# Patient Record
Sex: Female | Born: 1985 | Race: Black or African American | Hispanic: No | Marital: Single | Smoking: Current every day smoker
Health system: Southern US, Community
[De-identification: ages and names within clinical notes are randomized; demographics above are authoritative.]

## PROBLEM LIST (undated history)

## (undated) DIAGNOSIS — F329 Major depressive disorder, single episode, unspecified: Secondary | ICD-10-CM

## (undated) DIAGNOSIS — F419 Anxiety disorder, unspecified: Secondary | ICD-10-CM

## (undated) DIAGNOSIS — F32A Depression, unspecified: Secondary | ICD-10-CM

## (undated) HISTORY — PX: MOUTH SURGERY: SHX715

---

## 2015-01-23 ENCOUNTER — Telehealth: Payer: Self-pay

## 2015-01-23 NOTE — Telephone Encounter (Signed)
[facility] - Ambulatory Telephone Intake Screen     Patient Information:    Patient's Name: Patty Flores  Patient's Date of Birth: 06/12/1986  Patient's Medical Record Number: [id number]  Patient's Home Phone: [phone] (home)  Patient's Work Phone: [phone] (work)  [employer] AutoPatient's Mobile Phone:   No relevant phone numbers on file.     Patient's Marital Status (if applicable): Single  Can a message be left? yes      Insurance Information:    Medicaid   Policy number:     Referral Source:  Referral Source: self      Presenting Concern:     What is the reason you are seeking care?     (Please provide a brief description of the reasons the patient or referring provider is seeking mental health treatment at this time.  Please use patients own words where possible).    Depression, bipolar , bad reaction to meds                                    Mental Health History:    Are you currently involved in mental health treatment?  no     Are you currently taking a long acting injectable medication?  no    Will you need an injection at the time of your first intake assessment appointment?   no      Customer Service:    Do you need arrangements for?    Wheelchair: no    Interpreter Services:no

## 2015-03-01 NOTE — Progress Notes (Signed)
[facility] MISSED/CANCELLED APPOINTMENT     Name: Patty Flores [employer]  MRN: [id number]   DOB: 03/25/1986    Date of Scheduled Service: 03/01/15    Ms. Cadman was a no show for today's appointment.  I have sent the patient a letter regarding today's missed appointment.    Additional Information:    Not applicable

## 2015-11-07 ENCOUNTER — Telehealth: Payer: Self-pay | Admitting: Primary Care

## 2015-11-07 NOTE — Telephone Encounter (Signed)
This individual called, states she does not have a [place] doctor [place] in [place], she is working in a [employer] and needs a form completed and PPD done asap. She would like a new patient acute appointment. NPV scheduled for 11/28/15 with Dr. Ezzie DuralLis. Please advise.

## 2015-11-07 NOTE — Telephone Encounter (Signed)
Provided patient with 2 options today, unable to come in today at all. I explained to pt to call tomorrow to see if there are any open slots. Pt. verbalized understanding.

## 2015-11-08 ENCOUNTER — Encounter: Payer: Self-pay | Admitting: Primary Care

## 2015-11-08 ENCOUNTER — Ambulatory Visit: Payer: Self-pay | Admitting: Primary Care

## 2015-11-08 VITALS — BP 100/64 | HR 72 | Ht 61.5 in | Wt 207.0 lb

## 2015-11-08 DIAGNOSIS — F17200 Nicotine dependence, unspecified, uncomplicated: Secondary | ICD-10-CM | POA: Insufficient documentation

## 2015-11-08 DIAGNOSIS — Z0000 Encounter for general adult medical examination without abnormal findings: Secondary | ICD-10-CM

## 2015-11-08 NOTE — Telephone Encounter (Signed)
Spoke with pt, scheduled acute visit for today

## 2015-11-08 NOTE — Progress Notes (Signed)
[facility] - NEW PATIENT ACUTE OFFICE VISIT NOTE    SUBJECTIVE    1. Form completion  - Patty Flores presents today for PPD placement and completion of health clearance form to begin employment at a local daycare.  [place] within the past year, doing similar work previously.    ROS:  Gen: Generally feeling well  Eyes: No recent visual changes  ENT:  No nasal discharge, throat pain, hearing unchanged; several sensitive teeth, needs to establish care with dentist locally  CV:  No palpitations or chest pain, peripheral edema or claudication  Resp: No wheezing or shortness of breath  GI:  No nausea, vomiting or constipation; eating and drinking normally  GU:  Normal urination, no abnormal vaginal discharge or bleeding  MSK: No joint or muscle aches; no difficulty walking   Skin:  No noted rashes or other skin lesions  Neuro: No headaches, dizziness, numbness or tingling  Psych:  Mood stable  Endo: No extraordinary fatigue, skin/hair changes  Heme: No easy bruising    SOC: Lives with her fiancee of 6 months, Patty Flores, and her own 3 children ages 555,4 and 2. She does not drink EtOH or use illicit substance.  She smokes 3-4 cigarettes daily.    I personally reviewed the patients medications, problem list and PMSH in Epic today immediatly prior and/or during our visit.    OBJECTIVE    Vitals:    11/08/15 1029   BP: 100/64   Pulse: 72   Weight: 93.9 kg (207 lb)   Height: 1.562 m (5' 1.5")       Physical Exam:  GEN: Obese, otherwise well appearing, pleasant  EYES: Conjunctiva pink, pupils equal and reactive.    ENMT: Oropharynx pink, mucous membranes moist.  Adequate dentition.  NECK: Neck with normal appearance.  No thyromegaly, thyroid non-tender.  CV: Regular rate and rhythm, nl S1 and S2  RESP: Breathing unlabored, lungs clear to auscultation bilaterally  ABD: Normoactive bowel sounds, non-tender to palpation throughout.  No noted hepatomegaly.  MSK: Well developed, normal  gait.  PSYCH: Alert and oriented x3, affect appropriate    ASSESSMENT & PLAN    1. Nicotine dependence  - Counseled regarding importance of smoking cessation.  Pre-contemplative.  Reviewed importance of developing a quit plan, available pharmacologic treatments, and soliciting support from friends/family/co-workers.  Will continue to assess at future visits.     2. Healthcare maintenance  - Tdap >/= 1370yr(Boostrix)  - TB Skin Test  (AMBULATORY USE ONLY)  - Influenza vaccine and Pneumovax declined today  - Due for routine cervical cancer screening.  Asked to schedule appointment during the next 2-4 months      Patty Courtright, MD 10:32 AM  11/08/2015  Family Medicine

## 2015-11-08 NOTE — Telephone Encounter (Signed)
Pt calling to see if she can get a same day appt today,thanks

## 2015-11-10 ENCOUNTER — Ambulatory Visit: Payer: Self-pay

## 2015-11-10 LAB — TB SKIN TEST: Induration:TB skin test: NEGATIVE mm

## 2015-11-10 NOTE — Progress Notes (Signed)
PPD Reading Note  PPD read and results entered in EpicCare.  Result: 0 mm induration.  Interpretation: negtaive  Allergic reaction: no

## 2015-11-28 ENCOUNTER — Ambulatory Visit: Payer: Self-pay | Admitting: Primary Care

## 2016-04-17 ENCOUNTER — Ambulatory Visit: Payer: Self-pay | Admitting: Oral and Maxillofacial Surgery

## 2016-07-21 ENCOUNTER — Encounter (HOSPITAL_COMMUNITY): Payer: Self-pay

## 2016-07-21 ENCOUNTER — Emergency Department (HOSPITAL_COMMUNITY)
Admission: EM | Admit: 2016-07-21 | Discharge: 2016-07-21 | Disposition: A | Discharge status: Home / Self Care | Payer: Medicaid Other | Attending: Emergency Medicine | Admitting: Emergency Medicine

## 2016-07-21 DIAGNOSIS — F17210 Nicotine dependence, cigarettes, uncomplicated: Secondary | ICD-10-CM | POA: Insufficient documentation

## 2016-07-21 DIAGNOSIS — K0889 Other specified disorders of teeth and supporting structures: Secondary | ICD-10-CM | POA: Insufficient documentation

## 2016-07-21 DIAGNOSIS — F1290 Cannabis use, unspecified, uncomplicated: Secondary | ICD-10-CM | POA: Insufficient documentation

## 2016-07-21 HISTORY — DX: Anxiety disorder, unspecified: F41.9

## 2016-07-21 HISTORY — DX: Depression, unspecified: F32.A

## 2016-07-21 HISTORY — DX: Major depressive disorder, single episode, unspecified: F32.9

## 2016-07-21 MED ORDER — NAPROXEN 500 MG PO TABS: 500.0000 mg | ORAL_TABLET | Freq: Two times a day (BID) | ORAL | 0 refills | Status: DC

## 2016-07-21 MED ORDER — PENICILLIN V POTASSIUM 500 MG PO TABS: 500.0000 mg | ORAL_TABLET | Freq: Four times a day (QID) | ORAL | 0 refills | Status: DC

## 2016-07-21 NOTE — ED Notes (Signed)
Pt provided a warm pack by Raynelle Fanning NT, per request.

## 2016-07-21 NOTE — ED Provider Notes (Signed)
[facility]-EMERGENCY DEPT Provider Note   CSN: [id number] Arrival date & time: 07/21/16  1914  First Provider Contact:  First MD Initiated Contact with Patient 07/21/16 [phone]        History   Chief Complaint Chief Complaint  Patient presents with  . Dental Pain    HPI Betty Thompson is a 30 y.o. female.  Patient presents to the emergency department complaining of left upper dental pain ongoing for the past 2 months that has worsened recently. She went to 10 out of 10 pain currently. She reports she's been using Orajel without relief. She reports she feels her dental filling has come loose several months ago. She denies any discharge from her mouth. She denies any sore throat or trouble swallowing. She recently moved to the area and does not have a dental provider. She denies any allergies to any medications. She denies fevers, sore throat, trouble swallowing, neck pain, changes to her vision, or rashes.    Dental Pain   Pertinent negatives include no chills, no fever and no headaches.    Past Medical History:  Diagnosis Date  . Anxiety   . Depression     There are no active problems to display for this patient.   Past Surgical History:  Procedure Laterality Date  . CESAREAN SECTION    . MOUTH SURGERY      OB History    No data available       Home Medications    Prior to Admission medications   Medication Sig Start Date End Date Taking? Authorizing Provider  naproxen (NAPROSYN) 500 MG tablet Take 1 tablet (500 mg total) by mouth 2 (two) times daily with a meal. 07/21/16   Everlene Farrier, PA-C  penicillin v potassium (VEETID) 500 MG tablet Take 1 tablet (500 mg total) by mouth 4 (four) times daily. 07/21/16   Everlene Farrier, PA-C    Family History No family history on file.  Social History Social History  Substance Use Topics  . Smoking status: Current Every Day Smoker    Types: Cigarettes  . Smokeless tobacco: Never Used  . Alcohol use No     Allergies     Review of patient's allergies indicates not on file.   Review of Systems Review of Systems  Constitutional: Negative for chills and fever.  HENT: Positive for dental problem. Negative for drooling, ear pain, facial swelling, sore throat and trouble swallowing.   Eyes: Negative for pain and visual disturbance.  Musculoskeletal: Negative for neck pain and neck stiffness.  Skin: Negative for rash.  Neurological: Negative for headaches.     Physical Exam Updated Vital Signs BP 120/84 (BP Location: Left Arm)   Pulse 75   Temp 98.4 F (36.9 C) (Oral)   Resp 20   LMP 07/02/2016   SpO2 100%   Physical Exam  Constitutional: She is oriented to person, place, and time. She appears well-developed and well-nourished. No distress.  Nontoxic appearing.  HENT:  Head: Normocephalic and atraumatic.  Right Ear: External ear normal.  Left Ear: External ear normal.  Mouth/Throat: Oropharynx is clear and moist. No oropharyngeal exudate.  Tenderness to her left upper molars. No discharge or fluctuance. No gross abscess. Tongue protrusion is normal. Throat is clear. No tonsillar hypertrophy or exudates. Uvula is midline without edema. No trismus. Bilateral tympanic membranes are pearly-gray without erythema or loss of landmarks.   Eyes: Conjunctivae are normal. Pupils are equal, round, and reactive to light. Right eye exhibits no discharge. Left eye  exhibits no discharge.  Neck: Normal range of motion. Neck supple. No JVD present.  Cardiovascular: Normal rate, regular rhythm, normal heart sounds and intact distal pulses.   Pulmonary/Chest: Effort normal and breath sounds normal. No stridor. No respiratory distress.  Abdominal: Soft. There is no tenderness.  Lymphadenopathy:    She has no cervical adenopathy.  Neurological: She is alert and oriented to person, place, and time. No cranial nerve deficit.  Skin: Skin is warm and dry. Capillary refill takes less than 2 seconds. No rash noted. She is  not diaphoretic. No erythema. No pallor.  Psychiatric: She has a normal mood and affect. Her behavior is normal.  Nursing note and vitals reviewed.    ED Treatments / Results  Labs (all labs ordered are listed, but only abnormal results are displayed) Labs Reviewed - No data to display  EKG  EKG Interpretation None       Radiology No results found.  Procedures Procedures (including critical care time)  Medications Ordered in ED Medications - No data to display   Initial Impression / Assessment and Plan / ED Course  I have reviewed the triage vital signs and the nursing notes.    Clinical Course    Patient with left upper molar toothache.  No gross abscess.  Exam unconcerning for Ludwig's angina or spread of infection.  Will treat with penicillin and pain medicine.  Urged patient to follow-up with dentist.  Dental resource guide provided. I advised the patient to follow-up with their primary care provider this week. I advised the patient to return to the emergency department with new or worsening symptoms or new concerns. The patient verbalized understanding and agreement with plan.     Final Clinical Impressions(s) / ED Diagnoses   Final diagnoses:  Pain, dental    New Prescriptions New Prescriptions   NAPROXEN (NAPROSYN) 500 MG TABLET    Take 1 tablet (500 mg total) by mouth 2 (two) times daily with a meal.   PENICILLIN V POTASSIUM (VEETID) 500 MG TABLET    Take 1 tablet (500 mg total) by mouth 4 (four) times daily.     Everlene Farrier, PA-C 07/21/16 5974    Nelva Nay, MD 07/21/16 [phone]

## 2016-07-21 NOTE — ED Triage Notes (Signed)
Per EMS, Pt, from home, c/o L side dental pain x 2 months.  Pain score 10/10.  Pt reports using Orajel w/o relief.  Pt reports pain r/t cavity.

## 2017-08-15 ENCOUNTER — Emergency Department (HOSPITAL_COMMUNITY)
Admission: EM | Admit: 2017-08-15 | Discharge: 2017-08-15 | Disposition: A | Discharge status: Home / Self Care | Payer: Medicaid Other | Attending: Emergency Medicine | Admitting: Emergency Medicine

## 2017-08-15 ENCOUNTER — Encounter (HOSPITAL_COMMUNITY): Payer: Self-pay | Admitting: *Deleted

## 2017-08-15 DIAGNOSIS — M25571 Pain in right ankle and joints of right foot: Secondary | ICD-10-CM | POA: Insufficient documentation

## 2017-08-15 DIAGNOSIS — M25562 Pain in left knee: Secondary | ICD-10-CM | POA: Insufficient documentation

## 2017-08-15 DIAGNOSIS — G8929 Other chronic pain: Secondary | ICD-10-CM

## 2017-08-15 DIAGNOSIS — M25561 Pain in right knee: Secondary | ICD-10-CM | POA: Insufficient documentation

## 2017-08-15 DIAGNOSIS — F17210 Nicotine dependence, cigarettes, uncomplicated: Secondary | ICD-10-CM | POA: Insufficient documentation

## 2017-08-15 DIAGNOSIS — M25572 Pain in left ankle and joints of left foot: Secondary | ICD-10-CM | POA: Insufficient documentation

## 2017-08-15 MED ORDER — MELOXICAM 7.5 MG PO TABS: 15.0000 mg | ORAL_TABLET | Freq: Every day | ORAL | 0 refills | Status: DC

## 2017-08-15 NOTE — Discharge Instructions (Signed)
Take the prescribed medication as directed. Follow-up with the clinic as we have scheduled for you. Return to the ED for new or worsening symptoms.

## 2017-08-15 NOTE — ED Provider Notes (Signed)
[facility] DEPT Provider Note   CSN: [id number] Arrival date & time: 08/15/17  4628     History   Chief Complaint Chief Complaint  Patient presents with  . Knee Pain  . Ankle Pain    HPI Betty Thompson is a 31 y.o. female.  The history is provided by the patient and medical records.  Knee Pain    Ankle Pain       31 year old female with history of anxiety and depression, presenting to the ED for bilateral knee and ankle pain.  States she moved here from [place] about one year ago, has yet to find a PCP. States she has had pain in both of her knees for over 2 years now. Has been having ankle pain for the past 3 months. Denies any injury, trauma, or falls. States her knees and ankles feel very "stiff" with a lot of popping and clicking. She does stand on her feet 8 hours a day while working. She is not noticing any change with elevating her feet in the evening. She denies any numbness or weakness of her legs. She's not having any difficult walking.  Past Medical History:  Diagnosis Date  . Anxiety   . Depression     There are no active problems to display for this patient.   Past Surgical History:  Procedure Laterality Date  . CESAREAN SECTION    . MOUTH SURGERY      OB History    No data available       Home Medications    Prior to Admission medications   Medication Sig Start Date End Date Taking? Authorizing Provider  naproxen (NAPROSYN) 500 MG tablet Take 1 tablet (500 mg total) by mouth 2 (two) times daily with a meal. 07/21/16   Waynetta Pean, PA-C  penicillin v potassium (VEETID) 500 MG tablet Take 1 tablet (500 mg total) by mouth 4 (four) times daily. 07/21/16   Waynetta Pean, PA-C    Family History No family history on file.  Social History Social History  Substance Use Topics  . Smoking status: Current Every Day Smoker    Types: Cigarettes  . Smokeless tobacco: Never Used  . Alcohol use No     Allergies   Patient has no known  allergies.   Review of Systems Review of Systems  Musculoskeletal: Positive for arthralgias.  All other systems reviewed and are negative.    Physical Exam Updated Vital Signs BP 122/62 (BP Location: Left Arm)   Pulse 74   Temp 97.9 F (36.6 C) (Oral)   Resp 18   LMP 08/11/2017 (Exact Date)   SpO2 100%   Physical Exam  Constitutional: She is oriented to person, place, and time. She appears well-developed and well-nourished.  HENT:  Head: Normocephalic and atraumatic.  Mouth/Throat: Oropharynx is clear and moist.  Eyes: Pupils are equal, round, and reactive to light. Conjunctivae and EOM are normal.  Neck: Normal range of motion.  Cardiovascular: Normal rate, regular rhythm and normal heart sounds.   Pulmonary/Chest: Effort normal and breath sounds normal.  Abdominal: Soft. Bowel sounds are normal.  Musculoskeletal: Normal range of motion.  Both knees with crepitus with range of motion testing, right slightly worse than left, no acute bony deformities Bilateral ankles with trace edema, no apparent pain with range of motion, no deformities, DP pulses intact  Neurological: She is alert and oriented to person, place, and time.  Skin: Skin is warm and dry.  Psychiatric: She has a normal mood  and affect.  Nursing note and vitals reviewed.    ED Treatments / Results  Labs (all labs ordered are listed, but only abnormal results are displayed) Labs Reviewed - No data to display  EKG  EKG Interpretation None       Radiology No results found.  Procedures Procedures (including critical care time)  Medications Ordered in ED Medications - No data to display   Initial Impression / Assessment and Plan / ED Course  I have reviewed the triage vital signs and the nursing notes.  Pertinent labs & imaging results that were available during my care of the patient were reviewed by me and considered in my medical decision making (see chart for details).  31 year old female  here with bilateral knee and ankle pain. This is been an ongoing issue for her. No bony deformities on exam to suggest acute fracture. No reported trauma. Based on the nature of her symptoms and crepitus on exam, suspect there is component of arthritis. Doubt acute fracture or dislocation, imaging deferred.  Patient does not have any follow-up. We'll start her on meloxicam.  Case management has met with her to help assist with follow-up-- appt made for 9/13.  Discussed plan with patient, she acknowledged understanding and agreed with plan of care.  Return precautions given for new or worsening symptoms.  Final Clinical Impressions(s) / ED Diagnoses   Final diagnoses:  Chronic pain of both knees  Chronic pain of both ankles    New Prescriptions New Prescriptions   MELOXICAM (MOBIC) 7.5 MG TABLET    Take 2 tablets (15 mg total) by mouth daily.     Larene Pickett, PA-C 08/15/17 1213    Little, Wenda Overland, MD 08/16/17 2109

## 2017-08-15 NOTE — ED Triage Notes (Signed)
Pt is here bilateral knee pain for 2 years and now ankles hurt too.

## 2017-09-11 ENCOUNTER — Inpatient Hospital Stay (INDEPENDENT_AMBULATORY_CARE_PROVIDER_SITE_OTHER): Payer: Medicaid Other | Admitting: Physician Assistant

## 2017-11-13 ENCOUNTER — Emergency Department (HOSPITAL_COMMUNITY)
Admission: EM | Admit: 2017-11-13 | Discharge: 2017-11-13 | Disposition: A | Discharge status: Home / Self Care | Payer: Self-pay | Attending: Physician Assistant | Admitting: Physician Assistant

## 2017-11-13 ENCOUNTER — Encounter (HOSPITAL_COMMUNITY): Payer: Self-pay | Admitting: Emergency Medicine

## 2017-11-13 DIAGNOSIS — Z23 Encounter for immunization: Secondary | ICD-10-CM | POA: Insufficient documentation

## 2017-11-13 DIAGNOSIS — L0291 Cutaneous abscess, unspecified: Secondary | ICD-10-CM

## 2017-11-13 DIAGNOSIS — F17210 Nicotine dependence, cigarettes, uncomplicated: Secondary | ICD-10-CM | POA: Insufficient documentation

## 2017-11-13 DIAGNOSIS — L02416 Cutaneous abscess of left lower limb: Secondary | ICD-10-CM | POA: Insufficient documentation

## 2017-11-13 DIAGNOSIS — Z79899 Other long term (current) drug therapy: Secondary | ICD-10-CM | POA: Insufficient documentation

## 2017-11-13 MED ORDER — LIDOCAINE-EPINEPHRINE (PF) 2 %-1:200000 IJ SOLN
10.0000 mL | Freq: Once | INTRAMUSCULAR | Status: AC
  Administered 2017-11-13: 10 mL
  Filled 2017-11-13: qty 20

## 2017-11-13 MED ORDER — TETANUS-DIPHTH-ACELL PERTUSSIS 5-2.5-18.5 LF-MCG/0.5 IM SUSP
0.5000 mL | Freq: Once | INTRAMUSCULAR | Status: AC
  Administered 2017-11-13: 0.5 mL via INTRAMUSCULAR
  Filled 2017-11-13: qty 0.5

## 2017-11-13 MED ORDER — DOXYCYCLINE HYCLATE 100 MG PO CAPS: 100.0000 mg | ORAL_CAPSULE | Freq: Two times a day (BID) | ORAL | 0 refills | Status: DC

## 2017-11-13 NOTE — ED Provider Notes (Signed)
[facility] EMERGENCY [facility]PARTMENT Provider Note   CSN: [id number] Arrival date & time: 11/13/17  [id number]     History   Chief Complaint Chief Complaint  Patient presents with  . Abscess    HPI Betty Thompson is a 31 y.o. female presenting with abscess of left thigh.  Patient states that for the past 6 days, she has had a painful lesion of her medial left thigh.  There is been no drainage from this.  It has been increasingly painful.  She has not taken anything for pain.  Nothing makes it better, palpation makes it worse.  She denies history of abscesses before.  She denies fevers, chills, nausea, vomiting.  She is not immunocompromised.  She is not on blood thinners.  She does not know when her last tetanus shot was, thinks it was when she was 18 (~13 yrs ago).  She has no other medical problems, does not take medications daily.  HPI  Past Medical History:  Diagnosis Date  . Anxiety   . Depression     There are no active problems to display for this patient.   Past Surgical History:  Procedure Laterality Date  . CESAREAN SECTION    . MOUTH SURGERY      OB History    No data available       Home Medications    Prior to Admission medications   Medication Sig Start Date End Date Taking? Authorizing Provider  doxycycline (VIBRAMYCIN) 100 MG capsule Take 1 capsule (100 mg total) 2 (two) times daily by mouth. 11/13/17   Johnda Billiot, PA-C  meloxicam (MOBIC) 7.5 MG tablet Take 2 tablets (15 mg total) by mouth daily. 08/15/17   Garlon HatchetSanders, Lisa M, PA-C  naproxen (NAPROSYN) 500 MG tablet Take 1 tablet (500 mg total) by mouth 2 (two) times daily with a meal. 07/21/16   Everlene Farrieransie, William, PA-C  penicillin v potassium (VEETID) 500 MG tablet Take 1 tablet (500 mg total) by mouth 4 (four) times daily. 07/21/16   Everlene Farrieransie, William, PA-C    Family History No family history on file.  Social History Social History   Tobacco Use  . Smoking status: Current Every Day  Smoker    Types: Cigarettes  . Smokeless tobacco: Never Used  Substance Use Topics  . Alcohol use: No  . Drug use: Yes    Frequency: 7.0 times per week    Types: Marijuana     Allergies   Patient has no known allergies.   Review of Systems Review of Systems  Constitutional: Negative for chills and fever.  Skin: Positive for color change.  Hematological: Does not bruise/bleed easily.     Physical Exam Updated Vital Signs BP 112/69 (BP Location: Right Arm)   Pulse 77   Temp 98.4 F (36.9 C) (Oral)   Resp 16   Ht 5\' 2"  (1.575 m)   Wt 101.2 kg (223 lb)   LMP 10/31/2017   SpO2 97%   BMI 40.79 kg/m   Physical Exam  Constitutional: She is oriented to person, place, and time. She appears well-developed and well-nourished. No distress.  HENT:  Head: Normocephalic and atraumatic.  Eyes: EOM are normal.  Neck: Normal range of motion.  Cardiovascular: Normal rate, regular rhythm and intact distal pulses.  Pulmonary/Chest: Effort normal and breath sounds normal. No respiratory distress. She has no wheezes. She has no rales.  Abdominal: She exhibits no distension.  Musculoskeletal: Normal range of motion.  Neurological: She is alert and  oriented to person, place, and time.  Skin: Skin is warm. No rash noted.  Fluctuant tender lesion of left medial upper thigh without drainage.  No surrounding skin erythema or signs of cellulitis.  Psychiatric: She has a normal mood and affect.  Nursing note and vitals reviewed.    ED Treatments / Results  Labs (all labs ordered are listed, but only abnormal results are displayed) Labs Reviewed - No data to display  EKG  EKG Interpretation None       Radiology No results found.  Procedures .Marland Kitchen.Incision and Drainage Date/Time: 11/13/2017 9:30 AM Performed by: Alveria Apleyaccavale, Bereket Gernert, PA-C Authorized by: Alveria Apleyaccavale, Lavanya Roa, PA-C   Consent:    Consent obtained:  Verbal   Consent given by:  Patient   Risks discussed:  Incomplete  drainage, bleeding, infection and pain Location:    Type:  Abscess   Location:  Lower extremity   Lower extremity location:  Leg   Leg location:  L upper leg Pre-procedure details:    Skin preparation:  Betadine Anesthesia (see MAR for exact dosages):    Anesthesia method:  Local infiltration   Local anesthetic:  Lidocaine 2% WITH epi Procedure type:    Complexity:  Simple Procedure details:    Incision types:  Single straight   Incision depth:  Dermal   Scalpel blade:  11   Wound management:  Probed and deloculated and irrigated with saline   Drainage:  Purulent and bloody   Drainage amount:  Moderate   Wound treatment:  Wound left open   Packing materials:  None Post-procedure details:    Patient tolerance of procedure:  Tolerated well, no immediate complications   (including critical care time)  Medications Ordered in ED Medications  lidocaine-EPINEPHrine (XYLOCAINE W/EPI) 2 %-1:200000 (PF) injection 10 mL (10 mLs Infiltration Given 11/13/17 0930)  Tdap (BOOSTRIX) injection 0.5 mL (0.5 mLs Intramuscular Given 11/13/17 0928)     Initial Impression / Assessment and Plan / ED Course  I have reviewed the triage vital signs and the nursing notes.  Pertinent labs & imaging results that were available during my care of the patient were reviewed by me and considered in my medical decision making (see chart for details).     Patient presenting with abscess of left upper thigh.  No signs of systemic infection including fever, tachycardia, or vomiting.  Tetanus updated.  Abscess drained and purulent material expressed.  Aftercare instructions given.  Patient placed on antibiotics.  Patient to return if symptoms worsen.  At this time, patient appears safe for discharge.  Return precautions given.  Patient states she understands and agrees to plan.   Final Clinical Impressions(s) / ED Diagnoses   Final diagnoses:  Abscess    ED Discharge Orders        Ordered    doxycycline  (VIBRAMYCIN) 100 MG capsule  2 times daily     11/13/17 1001       Wellmanaccavale, Emerlyn Mehlhoff, PA-C 11/13/17 1834    Abelino DerrickMackuen, Courteney Lyn, MD 11/15/17 1519

## 2017-11-13 NOTE — ED Notes (Signed)
Pt verbalized understanding of discharge instructions and denies any further questions at this time.   

## 2017-11-13 NOTE — ED Notes (Signed)
ED Provider at bedside. 

## 2017-11-13 NOTE — Discharge Instructions (Signed)
Take antibiotics as prescribed.  Take the entire course, even if your symptoms are improved. Use Tylenol or ibuprofen as needed for pain. Keep the dressing on for the next 24 hours.  After this, you may remove dressing, wash gently soap and water, and reapply a new dressing.  Do this until it is no longer draining. Try not to wear tight fitting clothes while the cut is healing, as this may make it take longer to heal. Return to the emergency room if you develop fevers, chills, worsening symptoms, or any new or concerning symptoms.

## 2017-11-13 NOTE — ED Triage Notes (Signed)
Pt. Stated, I have a boil on the crease of my left upper leg.

## 2018-07-04 ENCOUNTER — Emergency Department (HOSPITAL_COMMUNITY): Payer: Self-pay

## 2018-07-04 ENCOUNTER — Emergency Department (HOSPITAL_COMMUNITY)
Admission: EM | Admit: 2018-07-04 | Discharge: 2018-07-05 | Disposition: A | Discharge status: Home / Self Care | Payer: Self-pay | Attending: Emergency Medicine | Admitting: Emergency Medicine

## 2018-07-04 ENCOUNTER — Encounter (HOSPITAL_COMMUNITY): Payer: Self-pay | Admitting: Emergency Medicine

## 2018-07-04 ENCOUNTER — Other Ambulatory Visit: Payer: Self-pay

## 2018-07-04 DIAGNOSIS — F17210 Nicotine dependence, cigarettes, uncomplicated: Secondary | ICD-10-CM | POA: Insufficient documentation

## 2018-07-04 DIAGNOSIS — R0602 Shortness of breath: Secondary | ICD-10-CM | POA: Insufficient documentation

## 2018-07-04 DIAGNOSIS — F439 Reaction to severe stress, unspecified: Secondary | ICD-10-CM | POA: Insufficient documentation

## 2018-07-04 DIAGNOSIS — Z79899 Other long term (current) drug therapy: Secondary | ICD-10-CM | POA: Insufficient documentation

## 2018-07-04 DIAGNOSIS — R079 Chest pain, unspecified: Secondary | ICD-10-CM | POA: Insufficient documentation

## 2018-07-04 LAB — I-STAT BETA HCG BLOOD, ED (MC, WL, AP ONLY): I-stat hCG, quantitative: <5 m[IU]/mL (ref <5)

## 2018-07-04 LAB — CBC
HEMATOCRIT: 45.2 % (ref 36.0–46.0)
HEMOGLOBIN: 14.4 g/dL (ref 12.0–15.0)
MCH: 28.9 pg (ref 26.0–34.0)
MCHC: 31.9 g/dL (ref 30.0–36.0)
MCV: 90.8 fL (ref 78.0–100.0)
Platelets: 277 10*3/uL (ref 150–400)
RBC: 4.98 MIL/uL (ref 3.87–5.11)
RDW: 13.2 % (ref 11.5–15.5)
WBC: 8.3 10*3/uL (ref 4.0–10.5)

## 2018-07-04 LAB — I-STAT TROPONIN, ED: Troponin i, poc: 0 ng/mL (ref 0.00–0.08)

## 2018-07-04 LAB — BASIC METABOLIC PANEL
ANION GAP: 7 (ref 5–15)
BUN: 11 mg/dL (ref 6–20)
CHLORIDE: 113 mmol/L — AB (ref 98–111)
CO2: 20 mmol/L — AB (ref 22–32)
Calcium: 8.8 mg/dL — ABNORMAL LOW (ref 8.9–10.3)
Creatinine, Ser: 0.92 mg/dL (ref 0.44–1.00)
GFR calc non Af Amer: >60 mL/min (ref >60)
GLUCOSE: 94 mg/dL (ref 70–99)
POTASSIUM: 4.3 mmol/L (ref 3.5–5.1)
Sodium: 140 mmol/L (ref 135–145)

## 2018-07-04 MED ORDER — HYDROXYZINE HCL 25 MG PO TABS
25.0000 mg | ORAL_TABLET | Freq: Once | ORAL | Status: AC
  Administered 2018-07-04: 25 mg via ORAL
  Filled 2018-07-04: qty 1

## 2018-07-04 NOTE — ED Triage Notes (Addendum)
Pt presents with c/o chest pain traveling from Right to Left. Pain only on right x 2 days, today began traveling to L side. Denies nausea, +shob x 2-3 days with intermittent palpitations.   Pt sobbing in triage stating she is very stressed d/t recent breakup

## 2018-07-04 NOTE — ED Provider Notes (Signed)
[facility] Provider Note   CSN: [id number] Arrival date & time: 07/04/18  2131     History   Chief Complaint Chief Complaint  Patient presents with  . Chest Pain    HPI Betty Thompson is a 32 y.o. female.  The history is provided by the patient and medical records.  Chest Pain      32 year old female with history of anxiety, depression, presenting to the ED with chest pain.  Reports this is been intermittent over the past 2 to 3 days.  States she will wake up in the night with "twinges" in the left side of her chest which causes her to be short of breath.  States occassionally she will feel some palpitations "like her heart skipped a beat", none currently.  She has not had any associated diaphoresis, nausea, vomiting, dizziness, or weakness.  She denies any known cardiac history.  Father had CAD.  Patient does smoke cigarettes and marijuana.  Patient states she has been very worried about this has not been sleeping very much which is causing her to feel very tired and fatigued.  She also has been under a great deal of stress--recently separated from the father of her children, currently staying with her mother but found out she has to move out by the end of the month.  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  She also has been donating plasma twice weekly for the past 3 months for extra income.  Past Medical History:  Diagnosis Date  . Anxiety   . Depression     There are no active problems to display for this patient.   Past Surgical History:  Procedure Laterality Date  . CESAREAN SECTION    . MOUTH SURGERY       OB History   None      Home Medications    Prior to Admission medications   Medication Sig Start Date End Date Taking? Authorizing Provider  doxycycline (VIBRAMYCIN) 100 MG capsule Take 1 capsule (100 mg  total) 2 (two) times daily by mouth. 11/13/17   Caccavale, Sophia, PA-C  meloxicam (MOBIC) 7.5 MG tablet Take 2 tablets (15 mg total) by mouth daily. 08/15/17   Garlon HatchetSanders, Jobani Sabado M, PA-C  naproxen (NAPROSYN) 500 MG tablet Take 1 tablet (500 mg total) by mouth 2 (two) times daily with a meal. 07/21/16   Everlene Farrieransie, William, PA-C  penicillin v potassium (VEETID) 500 MG tablet Take 1 tablet (500 mg total) by mouth 4 (four) times daily. 07/21/16   Everlene Farrieransie, William, PA-C    Family History No family history on file.  Social History Social History   Tobacco Use  . Smoking status: Current Every Day Smoker    Types: Cigarettes  . Smokeless tobacco: Never Used  Substance Use Topics  . Alcohol use: No  . Drug use: Yes    Frequency: 7.0 times per week    Types: Marijuana     Allergies   Patient has no known allergies.   Review of Systems Review of Systems  Cardiovascular: Positive for chest pain.  All other systems reviewed and are negative.    Physical Exam Updated Vital Signs BP 120/81   Pulse 86   Temp 98.2 F (36.8 C) (Oral)   Resp 20   Ht 5\' 2"  (1.575 m)   Wt 90.7 kg (200 lb)  LMP 06/27/2018   SpO2 100%   BMI 36.58 kg/m   Physical Exam  Constitutional: She is oriented to person, place, and time. She appears well-developed and well-nourished.  HENT:  Head: Normocephalic and atraumatic.  Mouth/Throat: Oropharynx is clear and moist.  Eyes: Pupils are equal, round, and reactive to light. Conjunctivae and EOM are normal.  Neck: Normal range of motion.  Cardiovascular: Normal rate, regular rhythm and normal heart sounds.  Pulmonary/Chest: Effort normal and breath sounds normal. She has no decreased breath sounds. She has no wheezes.  Abdominal: Soft. Bowel sounds are normal.  Musculoskeletal: Normal range of motion.  Neurological: She is alert and oriented to person, place, and time.  Skin: Skin is warm and dry.  Psychiatric: Her mood appears anxious.  Intermittently sobbing  during exam Denies SI/HI/AVH  Nursing note and vitals reviewed.    ED Treatments / Results  Labs (all labs ordered are listed, but only abnormal results are displayed) Labs Reviewed  BASIC METABOLIC PANEL - Abnormal; Notable for the following components:      Result Value   Chloride 113 (*)    CO2 20 (*)    Calcium 8.8 (*)    All other components within normal limits  CBC  I-STAT TROPONIN, ED  I-STAT BETA HCG BLOOD, ED ([facility], [facility], [facility] ONLY)    EKG EKG Interpretation  Date/Time:  Saturday July 04 2018 21:39:56 EDT Ventricular Rate:  91 PR Interval:  140 QRS Duration: 76 QT Interval:  340 QTC Calculation: 418 R Axis:   33 Text Interpretation:  Normal sinus rhythm with sinus arrhythmia Cannot rule out Anterior infarct , age undetermined Abnormal ECG No previous ECGs available Confirmed by Glynn Octave [phone]) on 07/04/2018 11:40:20 PM   Radiology Dg Chest 2 View  Result Date: 07/04/2018 CLINICAL DATA:  Initial evaluation for acute chest pain. EXAM: CHEST - 2 VIEW COMPARISON:  None. FINDINGS: The cardiac and mediastinal silhouettes are within normal limits. The lungs are normally inflated. No airspace consolidation, pleural effusion, or pulmonary edema is identified. There is no pneumothorax. No acute osseous abnormality identified. IMPRESSION: No active cardiopulmonary disease. Electronically Signed   By: Rise Mu M.D.   On: 07/04/2018 22:19    Procedures Procedures (including critical care time)  Medications Ordered in ED Medications  hydrOXYzine (ATARAX/VISTARIL) tablet 25 mg (25 mg Oral Given 07/04/18 2351)  ondansetron (ZOFRAN-ODT) disintegrating tablet 4 mg (4 mg Oral Given 07/05/18 0029)     Initial Impression / Assessment and Plan / ED Course  I have reviewed the triage vital signs and the nursing notes.  Pertinent labs & imaging results that were available during my care of the patient were reviewed by me and considered in my medical decision making (see  chart for details).  32 year old female presenting to the ED with chest pain.  Describes it as "twinges" intermittently over the past 2 days with some associated shortness of breath and transient palpitations, will "like her heart skipping a beat".  EKG is nonischemic.  Screening labs overall reassuring.  Chest x-ray is clear.  She has no signs of fluid overload on exam.  She is not tachycardic or hypoxic, no pleuritc component to chest pain, and no significant risk factors for PE.  Does admit she has been under a great deal of stress, intermittently sobbing during exam.  I do feel that stress and anxiety is significantly contributing to her symptoms..  In regards to her fatigue, this is likely multifactorial.  She has been donating  plasma twice a week and is also not been sleeping as she is worried about her chest pains.  States she feels like she needs something to "calm her down".  Will give trial of Vistaril.  She will need close follow-up, given referral to wellness clinic.  Discussed plan with patient, she acknowledged understanding and agreed with plan of care.  Return precautions given for new or worsening symptoms.  Final Clinical Impressions(s) / ED Diagnoses   Final diagnoses:  Chest pain in adult  Stress    ED Discharge Orders        Ordered    hydrOXYzine (ATARAX/VISTARIL) 25 MG tablet  Every 6 hours PRN     07/05/18 0034       Garlon Hatchet, PA-C 07/05/18 0038    Donnetta Hutching, MD 07/05/18 [phone]

## 2018-07-05 MED ORDER — HYDROXYZINE HCL 25 MG PO TABS: 25.0000 mg | ORAL_TABLET | Freq: Four times a day (QID) | ORAL | 0 refills | Status: DC | PRN

## 2018-07-05 MED ORDER — ONDANSETRON 4 MG PO TBDP
4.0000 mg | ORAL_TABLET | Freq: Once | ORAL | Status: AC
  Administered 2018-07-05: 4 mg via ORAL
  Filled 2018-07-05: qty 1

## 2018-07-05 NOTE — Discharge Instructions (Signed)
Take the prescribed medication as directed.  This should help you relax and hopefully will help you rest. Follow-up with the patient care center-- call to make an appt on Monday morning. Return to the ED for new or worsening symptoms.

## 2018-12-14 ENCOUNTER — Encounter: Payer: Self-pay | Admitting: Primary Care

## 2019-02-02 ENCOUNTER — Telehealth: Payer: Self-pay | Admitting: Primary Care

## 2019-02-02 NOTE — Telephone Encounter (Signed)
[facility] Worker Note    Summary:    Clinical research associate reached the Patient on 02/02/19 via phone call  Guardian Name:    Target outcome is adult access to primary care    Patient or guardian identified reasons for not attending appointments: insurance      Plan: Patient states that she is currently not working and does not qualify for insurance through DSS. Patient states that she would like to come in to be seen and also would like a referral for Dental. Patient says she has not had medical/insurance for years and is currently not working.     Education Provided:    Resources Provided:    Referrals Provided:      Web designer Follow-up:                  Follow up with patient via phone call in  days.      Upcoming appointments:    No future appointments.

## 2019-07-27 ENCOUNTER — Emergency Department (HOSPITAL_COMMUNITY)
Admission: EM | Admit: 2019-07-27 | Discharge: 2019-07-27 | Disposition: A | Discharge status: Home / Self Care | Payer: Self-pay | Attending: Emergency Medicine | Admitting: Emergency Medicine

## 2019-07-27 ENCOUNTER — Encounter (HOSPITAL_COMMUNITY): Payer: Self-pay | Admitting: Emergency Medicine

## 2019-07-27 DIAGNOSIS — R059 Cough, unspecified: Secondary | ICD-10-CM

## 2019-07-27 DIAGNOSIS — R05 Cough: Secondary | ICD-10-CM | POA: Insufficient documentation

## 2019-07-27 DIAGNOSIS — F17210 Nicotine dependence, cigarettes, uncomplicated: Secondary | ICD-10-CM | POA: Insufficient documentation

## 2019-07-27 DIAGNOSIS — Z79899 Other long term (current) drug therapy: Secondary | ICD-10-CM | POA: Insufficient documentation

## 2019-07-27 DIAGNOSIS — Z20828 Contact with and (suspected) exposure to other viral communicable diseases: Secondary | ICD-10-CM | POA: Insufficient documentation

## 2019-07-27 MED ORDER — ACETAMINOPHEN 325 MG PO TABS
650.0000 mg | ORAL_TABLET | Freq: Once | ORAL | Status: AC
  Administered 2019-07-27: 650 mg via ORAL
  Filled 2019-07-27: qty 2

## 2019-07-27 NOTE — Discharge Instructions (Addendum)
You have been seen today for fatigue, cough. Please read and follow all provided instructions. Return to the emergency room for worsening condition or new concerning symptoms.    Your coronavirus test is pending.  You will need to continue to self quarantine until you have the results.  Positive you will need to self quarantine for 2 weeks.  1. Medications:  Continue taking Tylenol and ibuprofen for pain as needed.  Tylenol will help your body aches and pains.  You will probably have to take them for several days, take them as prescribed on the bottle. -Also take over the counter medicines for your cough if needed  Continue usual home medications Take medications as prescribed. Please review all of the medicines and only take them if you do not have an allergy to them.  2. Treatment: rest, drink plenty of fluids 3. Follow Up: Please follow up with your primary doctor in 2-5 days for discussion of your diagnoses and further evaluation after today's visit; Call today to arrange your follow up.  If you do not have a primary care doctor use the resource guide provided to find one;   It is also a possibility that you have an allergic reaction to any of the medicines that you have been prescribed - Everybody reacts differently to medications and while MOST people have no trouble with most medicines, you may have a reaction such as nausea, vomiting, rash, swelling, shortness of breath. If this is the case, please stop taking the medicine immediately and contact your physician.  ?

## 2019-07-27 NOTE — ED Triage Notes (Signed)
Pt states for the last 5 days she has had a "cold' with cough body aches feeling fatigued. Pt reports the fatigue feeling has got worse over last last day or so. Pt denies being tested for covid or any known exposure.

## 2019-07-27 NOTE — ED Provider Notes (Signed)
[facility]   CSN: [id number] Arrival date & time: 07/27/19  1301    History   Chief Complaint Chief Complaint  Patient presents with  . Cough  . Fever    HPI Betty Clientykia Thompson is a 33 y.o. female with past medical history of anxiety and depression presents to emergency department today with chief complaint of cough and generalized body aches x 5 days. Pt states cough is non productive. She also reports associated fatigue since yesterday. She did not take any medications for her symptoms prior to arrival. 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 No known covid 19 exposure.   She denies fever, chills, shortness of breath, sore throat, congestion, chest pain, abdominal pain, nausea, vomiting, diarrhea.  History provided by patient with additional history obtained from chart review.       Past Medical History:  Diagnosis Date  . Anxiety   . Depression     There are no active problems to display for this patient.   Past Surgical History:  Procedure Laterality Date  . CESAREAN SECTION    . MOUTH SURGERY       OB History   No obstetric history on file.      Home Medications    Prior to Admission medications   Medication Sig Start Date End Date Taking? Authorizing Provider  doxycycline (VIBRAMYCIN) 100 MG capsule Take 1 capsule (100 mg total) 2 (two) times daily by mouth. Patient not taking: Reported on 07/05/2018 11/13/17   Caccavale, Sophia, PA-C  hydrOXYzine (ATARAX/VISTARIL) 25 MG tablet Take 1 tablet (25 mg total) by mouth every 6 (six) hours as needed for anxiety. 07/05/18   Garlon HatchetSanders, Lisa M, PA-C  meloxicam (MOBIC) 7.5 MG tablet Take 2 tablets (15 mg total) by mouth daily. Patient not taking: Reported on 07/05/2018 08/15/17   Garlon HatchetSanders, Lisa M, PA-C  naproxen (NAPROSYN) 500 MG tablet Take 1 tablet (500 mg total) by mouth 2 (two) times daily with a meal. Patient not taking: Reported on 07/05/2018 07/21/16    Everlene Farrieransie, William, PA-C  penicillin v potassium (VEETID) 500 MG tablet Take 1 tablet (500 mg total) by mouth 4 (four) times daily. Patient not taking: Reported on 07/05/2018 07/21/16   Everlene Farrieransie, William, PA-C    Family History No family history on file.  Social History Social History   Tobacco Use  . Smoking status: Current Every Day Smoker    Types: Cigarettes  . Smokeless tobacco: Never Used  Substance Use Topics  . Alcohol use: No  . Drug use: Yes    Frequency: 7.0 times per week    Types: Marijuana     Allergies   Benadryl [diphenhydramine]   Review of Systems Review of Systems  Constitutional: Positive for fatigue.  HENT: Negative for congestion, ear pain, facial swelling, sinus pressure, sinus pain, sore throat, trouble swallowing and voice change.   Respiratory: Positive for cough. Negative for chest tightness, shortness of breath and wheezing.   Cardiovascular: Negative for chest pain.  Gastrointestinal: Negative for abdominal pain, diarrhea, nausea and vomiting.  Musculoskeletal: Positive for arthralgias.  Allergic/Immunologic: Negative for immunocompromised state.     Physical Exam Updated Vital Signs BP 101/67 (BP Location: Right Arm)   Pulse 80   Temp 98.1 F (36.7 C) (Oral)   Resp 16   SpO2 100%   Physical Exam Vitals signs and nursing note reviewed.  Constitutional:      Appearance: She is well-developed. She  is not ill-appearing or toxic-appearing.  HENT:     Head: Normocephalic and atraumatic.     Comments: No sinus or temporal tenderness.    Right Ear: Tympanic membrane and external ear normal.     Left Ear: Tympanic membrane and external ear normal.     Nose: Nose normal.     Mouth/Throat:     Comments: No erythema to oropharynx, no edema, no exudate, no tonsillar swelling, voice normal, neck supple without lymphadenopathy  Eyes:     General: No scleral icterus.       Right eye: No discharge.        Left eye: No discharge.      Conjunctiva/sclera: Conjunctivae normal.  Neck:     Musculoskeletal: Normal range of motion.     Vascular: No JVD.  Cardiovascular:     Rate and Rhythm: Normal rate and regular rhythm.     Pulses: Normal pulses.     Heart sounds: Normal heart sounds.  Pulmonary:     Effort: Pulmonary effort is normal.     Breath sounds: Normal breath sounds.  Chest:     Chest wall: No tenderness.  Abdominal:     General: There is no distension.  Musculoskeletal: Normal range of motion.  Skin:    General: Skin is warm and dry.  Neurological:     Mental Status: She is oriented to person, place, and time.     GCS: GCS eye subscore is 4. GCS verbal subscore is 5. GCS motor subscore is 6.     Comments: Fluent speech, no facial droop.  Psychiatric:        Behavior: Behavior normal.      ED Treatments / Results  Labs (all labs ordered are listed, but only abnormal results are displayed) Labs Reviewed  NOVEL CORONAVIRUS, NAA (HOSPITAL ORDER, SEND-OUT TO REF LAB)    EKG None  Radiology No results found.  Procedures Procedures (including critical care time)  Medications Ordered in ED Medications  acetaminophen (TYLENOL) tablet 650 mg (650 mg Oral Given 07/27/19 1436)     Initial Impression / Assessment and Plan / ED Course  I have reviewed the triage vital signs and the nursing notes.  Pertinent labs & imaging results that were available during my care of the patient were reviewed by me and considered in my medical decision making (see chart for details).  Pt is very well appearing, in no acute distress. She is afebrile, VSS. Lungs are clear to auscultation in all fields. After PO tylenol pt reports feeling better. Will perform send out covid test and instruct pt to self quaratine until she has results and to continue symptomatic treatment.  Evaluation does not show pathology that would require ongoing emergent intervention or inpatient treatment. I explained the diagnosis to the  patient.  Patient is comfortable with above plan and is stable for discharge at this time. All questions were answered prior to disposition.  Strict return precautions for returning to the ED were discussed. Encouraged follow up with PCP. Provided patient with a list of clinic resources to use if they do not have a PCP. Instructed to call them today to arrange follow-up in the next 24-48 hours.   Betty Thompson was evaluated in Emergency Department on 07/27/2019 for the symptoms described in the history of present illness. She was evaluated in the context of the global COVID-19 pandemic, which necessitated consideration that the patient might be at risk for infection with the SARS-CoV-2 virus that causes  COVID-19. Institutional protocols and algorithms that pertain to the evaluation of patients at risk for COVID-19 are in a state of rapid change based on information released by regulatory bodies including the CDC and federal and state organizations. These policies and algorithms were followed during the patient's care in the ED.   This note was prepared using Dragon voice recognition software and may include unintentional dictation errors due to the inherent limitations of voice recognition software.    Final Clinical Impressions(s) / ED Diagnoses   Final diagnoses:  Cough    ED Discharge Orders    None       Kathyrn Lasslbrizze, Sheva Mcdougle E, PA-C 07/27/19 2151    Cathren LaineSteinl, Kevin, MD 07/28/19 1332

## 2019-07-28 LAB — NOVEL CORONAVIRUS, NAA (HOSP ORDER, SEND-OUT TO REF LAB; TAT 18-24 HRS): SARS-CoV-2, NAA: NOT DETECTED

## 2019-09-20 ENCOUNTER — Encounter (HOSPITAL_COMMUNITY): Payer: Self-pay | Admitting: Emergency Medicine

## 2019-09-20 ENCOUNTER — Emergency Department (HOSPITAL_COMMUNITY)
Admission: EM | Admit: 2019-09-20 | Discharge: 2019-09-20 | Disposition: A | Discharge status: Home / Self Care | Payer: Self-pay | Attending: Emergency Medicine | Admitting: Emergency Medicine

## 2019-09-20 ENCOUNTER — Emergency Department (HOSPITAL_COMMUNITY): Payer: Self-pay

## 2019-09-20 ENCOUNTER — Other Ambulatory Visit: Payer: Self-pay

## 2019-09-20 DIAGNOSIS — Z20828 Contact with and (suspected) exposure to other viral communicable diseases: Secondary | ICD-10-CM | POA: Insufficient documentation

## 2019-09-20 DIAGNOSIS — R0789 Other chest pain: Secondary | ICD-10-CM

## 2019-09-20 DIAGNOSIS — J069 Acute upper respiratory infection, unspecified: Secondary | ICD-10-CM | POA: Insufficient documentation

## 2019-09-20 DIAGNOSIS — F17210 Nicotine dependence, cigarettes, uncomplicated: Secondary | ICD-10-CM | POA: Insufficient documentation

## 2019-09-20 LAB — CBC WITH DIFFERENTIAL/PLATELET
Abs Immature Granulocytes: 0 10*3/uL (ref 0.00–0.07)
Basophils Absolute: 0 10*3/uL (ref 0.0–0.1)
Basophils Relative: 0 %
Eosinophils Absolute: 0.1 10*3/uL (ref 0.0–0.5)
Eosinophils Relative: 3 %
HCT: 40.4 % (ref 36.0–46.0)
Hemoglobin: 13.6 g/dL (ref 12.0–15.0)
Immature Granulocytes: 0 %
Lymphocytes Relative: 48 %
Lymphs Abs: 2.2 10*3/uL (ref 0.7–4.0)
MCH: 29.1 pg (ref 26.0–34.0)
MCHC: 33.7 g/dL (ref 30.0–36.0)
MCV: 86.5 fL (ref 80.0–100.0)
Monocytes Absolute: 0.4 10*3/uL (ref 0.1–1.0)
Monocytes Relative: 9 %
Neutro Abs: 1.8 10*3/uL (ref 1.7–7.7)
Neutrophils Relative %: 40 %
Platelets: 238 10*3/uL (ref 150–400)
RBC: 4.67 MIL/uL (ref 3.87–5.11)
RDW: 12.1 % (ref 11.5–15.5)
WBC: 4.6 10*3/uL (ref 4.0–10.5)
nRBC: 0 % (ref 0.0–0.2)

## 2019-09-20 LAB — COMPREHENSIVE METABOLIC PANEL
ALT: 13 U/L (ref 0–44)
AST: 16 U/L (ref 15–41)
Albumin: 3.7 g/dL (ref 3.5–5.0)
Alkaline Phosphatase: 44 U/L (ref 38–126)
Anion gap: 7 (ref 5–15)
BUN: 5 mg/dL — ABNORMAL LOW (ref 6–20)
CO2: 24 mmol/L (ref 22–32)
Calcium: 9.1 mg/dL (ref 8.9–10.3)
Chloride: 110 mmol/L (ref 98–111)
Creatinine, Ser: 0.55 mg/dL (ref 0.44–1.00)
GFR calc Af Amer: >60 mL/min (ref >60)
GFR calc non Af Amer: >60 mL/min (ref >60)
Glucose, Bld: 95 mg/dL (ref 70–99)
Potassium: 3.9 mmol/L (ref 3.5–5.1)
Sodium: 141 mmol/L (ref 135–145)
Total Bilirubin: 0.5 mg/dL (ref 0.3–1.2)
Total Protein: 7 g/dL (ref 6.5–8.1)

## 2019-09-20 MED ORDER — IBUPROFEN 800 MG PO TABS: 800.0000 mg | ORAL_TABLET | Freq: Three times a day (TID) | ORAL | 0 refills | Status: DC

## 2019-09-20 MED ORDER — HYDROCODONE-ACETAMINOPHEN 5-325 MG PO TABS: 1.0000 | ORAL_TABLET | ORAL | 0 refills | Status: DC | PRN

## 2019-09-20 MED ORDER — HYDROCODONE-ACETAMINOPHEN 5-325 MG PO TABS
2.0000 | ORAL_TABLET | Freq: Once | ORAL | Status: AC
  Administered 2019-09-20: 2 via ORAL
  Filled 2019-09-20: qty 2

## 2019-09-20 MED ORDER — KETOROLAC TROMETHAMINE 60 MG/2ML IM SOLN
60.0000 mg | Freq: Once | INTRAMUSCULAR | Status: AC
  Administered 2019-09-20: 60 mg via INTRAMUSCULAR
  Filled 2019-09-20: qty 2

## 2019-09-20 NOTE — Discharge Instructions (Signed)
Return if any problems.  See your Physician for recheck in 2-3 days °

## 2019-09-20 NOTE — ED Triage Notes (Signed)
Pt reports chest pain for 3 days seems to become worse after eating. Pt also developed a cough yesterday with bloody tinged sputum. Pt denies sob with ems.    100% on room air. 60 NSR.

## 2019-09-20 NOTE — ED Notes (Signed)
Pt verbalized understanding of discharge instructions and follow up care. Pt given Good RX coupon to relieve financial strain. Pt ambulatory to lobby with steady gait. Pt given opportunity to ask and have questions answered,

## 2019-09-20 NOTE — ED Provider Notes (Signed)
[facility] EMERGENCY DEPARTMENT Provider Note   CSN: [id number] Arrival date & time: 09/20/19  4818     History   Chief Complaint Chief Complaint  Patient presents with  . Chest Pain    HPI Betty Thompson is a 33 y.o. female.     Pt complains of soreness in her chest with cough.   The history is provided by the patient. No language interpreter was used.  Cough Cough characteristics:  Non-productive Sputum characteristics:  Nondescript Severity:  Moderate Onset quality:  Gradual Duration:  3 days Timing:  Constant Progression:  Worsening Smoker: no   Relieved by:  Nothing Worsened by:  Nothing Ineffective treatments:  None tried Associated symptoms: no chest pain, no fever, no shortness of breath and no sinus congestion     Past Medical History:  Diagnosis Date  . Anxiety   . Depression     There are no active problems to display for this patient.   Past Surgical History:  Procedure Laterality Date  . CESAREAN SECTION    . MOUTH SURGERY       OB History   No obstetric history on file.      Home Medications    Prior to Admission medications   Medication Sig Start Date End Date Taking? Authorizing Provider  HYDROcodone-acetaminophen (NORCO/VICODIN) 5-325 MG tablet Take 1 tablet by mouth every 4 (four) hours as needed. 09/20/19   Elson Areas, PA-C  hydrOXYzine (ATARAX/VISTARIL) 25 MG tablet Take 1 tablet (25 mg total) by mouth every 6 (six) hours as needed for anxiety. 07/05/18   Garlon Hatchet, PA-C  ibuprofen (ADVIL) 800 MG tablet Take 1 tablet (800 mg total) by mouth 3 (three) times daily. 09/20/19   Elson Areas, PA-C    Family History No family history on file.  Social History Social History   Tobacco Use  . Smoking status: Current Every Day Smoker    Types: Cigarettes  . Smokeless tobacco: Never Used  Substance Use Topics  . Alcohol use: No  . Drug use: Yes    Frequency: 7.0 times per week    Types: Marijuana     Allergies   Benadryl [diphenhydramine]   Review of Systems Review of Systems  Constitutional: Negative for fever.  Respiratory: Positive for cough. Negative for shortness of breath.   Cardiovascular: Negative for chest pain.  All other systems reviewed and are negative.    Physical Exam Updated Vital Signs BP 114/67 (BP Location: Right Arm)   Pulse 60   Temp 98.4 F (36.9 C)   Resp 15   Ht 5\' 1"  (1.549 m)   Wt 90.7 kg   LMP 09/20/2019   SpO2 100%   BMI 37.79 kg/m   Physical Exam Vitals signs and nursing note reviewed.  Constitutional:      Appearance: She is well-developed.  HENT:     Head: Normocephalic.  Neck:     Musculoskeletal: Normal range of motion.  Cardiovascular:     Rate and Rhythm: Normal rate and regular rhythm.     Heart sounds: Normal heart sounds.  Pulmonary:     Effort: Pulmonary effort is normal.     Breath sounds: Normal breath sounds.  Chest:     Chest wall: Tenderness present.  Abdominal:     General: There is no distension.     Palpations: Abdomen is soft.  Musculoskeletal: Normal range of motion.  Skin:    General: Skin is warm.  Neurological:  General: No focal deficit present.     Mental Status: She is alert and oriented to person, place, and time.  Psychiatric:        Mood and Affect: Mood normal.      ED Treatments / Results  Labs (all labs ordered are listed, but only abnormal results are displayed) Labs Reviewed  COMPREHENSIVE METABOLIC PANEL - Abnormal; Notable for the following components:      Result Value   BUN 5 (*)    All other components within normal limits  NOVEL CORONAVIRUS, NAA (HOSP ORDER, SEND-OUT TO REF LAB; TAT 18-24 HRS)  CBC WITH DIFFERENTIAL/PLATELET    EKG EKG Interpretation  Date/Time:  Monday September 20 2019 09:23:28 EDT Ventricular Rate:  59 PR Interval:    QRS Duration: 92 QT Interval:  406 QTC Calculation: 403 R Axis:   37 Text Interpretation:  Sinus rhythm Early  repolarization similar to earlier in the day Confirmed by Sherwood Gambler [phone]) on 09/20/2019 9:27:48 AM   Radiology Dg Chest Port 1 View  Result Date: 09/20/2019 CLINICAL DATA:  Cough, shortness of breath EXAM: PORTABLE CHEST 1 VIEW COMPARISON:  07/04/2018 FINDINGS: Mild cardiomegaly. Both lungs are clear. The visualized skeletal structures are unremarkable. IMPRESSION: Mild cardiomegaly without acute abnormality of the lungs in AP portable projection. Electronically Signed   By: Eddie Candle M.D.   On: 09/20/2019 10:28    Procedures Procedures (including critical care time)  Medications Ordered in ED Medications  HYDROcodone-acetaminophen (NORCO/VICODIN) 5-325 MG per tablet 2 tablet (2 tablets Oral Given 09/20/19 1023)  ketorolac (TORADOL) injection 60 mg (60 mg Intramuscular Given 09/20/19 1426)     Initial Impression / Assessment and Plan / ED Course  I have reviewed the triage vital signs and the nursing notes.  Pertinent labs & imaging results that were available during my care of the patient were reviewed by me and considered in my medical decision making (see chart for details).        MDM   Results reviewed and discussed with pt.  Pt advised to see her MD for recheck. Covid pending.  Final Clinical Impressions(s) / ED Diagnoses   Final diagnoses:  Chest wall pain  Viral upper respiratory tract infection    ED Discharge Orders         Ordered    ibuprofen (ADVIL) 800 MG tablet  3 times daily,   Status:  Discontinued     09/20/19 1406    HYDROcodone-acetaminophen (NORCO/VICODIN) 5-325 MG tablet  Every 4 hours PRN,   Status:  Discontinued     09/20/19 1406    HYDROcodone-acetaminophen (NORCO/VICODIN) 5-325 MG tablet  Every 4 hours PRN     09/20/19 1408    ibuprofen (ADVIL) 800 MG tablet  3 times daily     09/20/19 1408        An After Visit Summary was printed and given to the patient.    Fransico Meadow, [place] 09/21/19 Gall[place], MD  09/26/19 [phone]

## 2019-09-22 LAB — NOVEL CORONAVIRUS, NAA (HOSP ORDER, SEND-OUT TO REF LAB; TAT 18-24 HRS): SARS-CoV-2, NAA: NOT DETECTED

## 2019-10-28 ENCOUNTER — Other Ambulatory Visit: Payer: Self-pay

## 2019-10-28 ENCOUNTER — Encounter (HOSPITAL_COMMUNITY): Payer: Self-pay

## 2019-10-28 ENCOUNTER — Emergency Department (HOSPITAL_COMMUNITY)
Admission: EM | Admit: 2019-10-28 | Discharge: 2019-10-28 | Disposition: A | Discharge status: Home / Self Care | Payer: Self-pay | Attending: Emergency Medicine | Admitting: Emergency Medicine

## 2019-10-28 DIAGNOSIS — Z20822 Contact with and (suspected) exposure to covid-19: Secondary | ICD-10-CM

## 2019-10-28 DIAGNOSIS — F17210 Nicotine dependence, cigarettes, uncomplicated: Secondary | ICD-10-CM | POA: Insufficient documentation

## 2019-10-28 DIAGNOSIS — Z20828 Contact with and (suspected) exposure to other viral communicable diseases: Secondary | ICD-10-CM | POA: Insufficient documentation

## 2019-10-28 DIAGNOSIS — F1490 Cocaine use, unspecified, uncomplicated: Secondary | ICD-10-CM | POA: Insufficient documentation

## 2019-10-28 NOTE — Discharge Instructions (Addendum)
Today you were were tested for the coronavirus.  The results will be available in the next 3 to 5 days.  If the results are positive the hospital will contact you.  If they are negative the hospital would not contact you.  You will need to self quarantine until you are aware of your results.  If they are positive you will need to self quarantine as directed below.  You should be isolated for at least 7 days since the onset of your symptoms AND >72 hours after symptoms resolution (absence of fever without the use of fever reducing medication and improvement in respiratory symptoms), whichever is longer.  Please follow up with your primary care provider within 5-7 days for re-evaluation of your symptoms. If you do not have a primary care provider, information for a healthcare clinic has been provided for you to make arrangements for follow up care. Please return to the emergency department for any new or worsening symptoms including shortness of breath or chest pain.

## 2019-10-28 NOTE — ED Triage Notes (Signed)
Pt arrives POV for testing after known exposure to covid at work yesterday. Pt denies sx.

## 2019-10-28 NOTE — ED Notes (Signed)
Patient verbalizes understanding of discharge instructions. Opportunity for questioning and answers were provided. Armband removed by staff, pt discharged from ED. Ambulated out to lobby  

## 2019-10-28 NOTE — ED Provider Notes (Signed)
[facility]PART[facility] Provider Note   CSN: [id number] Arrival date & time: 10/28/19  1214     History   Chief Complaint Chief Complaint  Patient presents with  . Covid Testing    HPI Betty Thompson is a 33 y.o. female.     HPI   Patient is a 33 year old female with a history of anxiety/depression presents the emergency department today for requesting Covid testing.  States she was around someone at work yesterday who tested positive for Covid.  She was in close proximity to this person and the person was not wearing a mask.  She has no symptoms today and is stating that she needs testing to go back to work.  Past Medical History:  Diagnosis Date  . Anxiety   . Depression     There are no active problems to display for this patient.   Past Surgical History:  Procedure Laterality Date  . CESAREAN SECTION    . MOUTH SURGERY       OB History   No obstetric history on file.      Home Medications    Prior to Admission medications   Medication Sig Start Date End Date Taking? Authorizing Provider  HYDROcodone-acetaminophen (NORCO/VICODIN) 5-325 MG tablet Take 1 tablet by mouth every 4 (four) hours as needed. 09/20/19   Fransico Meadow, PA-C  hydrOXYzine (ATARAX/VISTARIL) 25 MG tablet Take 1 tablet (25 mg total) by mouth every 6 (six) hours as needed for anxiety. 07/05/18   Larene Pickett, PA-C  ibuprofen (ADVIL) 800 MG tablet Take 1 tablet (800 mg total) by mouth 3 (three) times daily. 09/20/19   Fransico Meadow, PA-C    Family History History reviewed. No pertinent family history.  Social History Social History   Tobacco Use  . Smoking status: Current Every Day Smoker    Types: Cigarettes  . Smokeless tobacco: Never Used  Substance Use Topics  . Alcohol use: No  . Drug use: Yes    Frequency: 7.0 times per week    Types: Marijuana     Allergies   Benadryl [diphenhydramine]   Review of Systems Review of Systems   Constitutional: Negative for fever.  Eyes: Negative for visual disturbance.  Respiratory: Negative for cough and shortness of breath.   Cardiovascular: Negative for chest pain.  Gastrointestinal: Negative for abdominal pain, diarrhea, nausea and vomiting.  Genitourinary: Negative for pelvic pain.  Musculoskeletal: Negative for back pain.  Skin: Negative for rash.  Neurological: Negative for headaches.     Physical Exam Updated Vital Signs BP 122/83 (BP Location: Left Arm)   Pulse 79   Temp 98 F (36.7 C)   Resp 16   Ht 5\' 1"  (1.549 m)   Wt 90.7 kg   SpO2 98%   BMI 37.79 kg/m   Physical Exam Vitals signs and nursing note reviewed.  Constitutional:      General: She is not in acute distress.    Appearance: She is well-developed.  HENT:     Head: Normocephalic and atraumatic.     Mouth/Throat:     Pharynx: No oropharyngeal exudate or posterior oropharyngeal erythema.  Eyes:     Conjunctiva/sclera: Conjunctivae normal.  Neck:     Musculoskeletal: Neck supple.  Cardiovascular:     Rate and Rhythm: Normal rate and regular rhythm.  Pulmonary:     Effort: Pulmonary effort is normal.     Breath sounds: Normal breath sounds.  Abdominal:     Palpations:  Abdomen is soft.     Tenderness: There is no abdominal tenderness.  Musculoskeletal: Normal range of motion.  Skin:    General: Skin is warm and dry.  Neurological:     Mental Status: She is alert.      ED Treatments / Results  Labs (all labs ordered are listed, but only abnormal results are displayed) Labs Reviewed  NOVEL CORONAVIRUS, NAA (HOSP ORDER, SEND-OUT TO REF LAB; TAT 18-24 HRS)    EKG None  Radiology No results found.  Procedures Procedures (including critical care time)  Medications Ordered in ED Medications - No data to display   Initial Impression / Assessment and Plan / ED Course  I have reviewed the triage vital signs and the nursing notes.  Pertinent labs & imaging results that were  available during my care of the patient were reviewed by me and considered in my medical decision making (see chart for details).     Final Clinical Impressions(s) / ED Diagnoses   Final diagnoses:  Suspected COVID-19 virus infection   Patient is a 33 year old female with a history of anxiety/depression presents the emergency department today for requesting Covid testing.  States she was around someone at work yesterday who tested positive for Covid.  She was in close proximity to this person and the person was not wearing a mask.  She has no symptoms today and is stating that she needs testing to go back to work.  I advised that because the patient is asymptomatic and her exposure was so recent that the taste she has may be a false negative.  I advised that before she go back to work she follow-up with PCP for clearance.  I also recommended quarantine precautions.  Advised on return precautions.  She voices understanding of the plan and reasons to return.  All questions answered.  Patient stable for discharge.  -----  Betty Thompson was evaluated in Emergency Department on 10/28/2019 for the symptoms described in the history of present illness. She was evaluated in the context of the global COVID-19 pandemic, which necessitated consideration that the patient might be at risk for infection with the SARS-CoV-2 virus that causes COVID-19. Institutional protocols and algorithms that pertain to the evaluation of patients at risk for COVID-19 are in a state of rapid change based on information released by regulatory bodies including the CDC and federal and state organizations. These policies and algorithms were followed during the patient's care in the ED.   ED Discharge Orders    None       Rayne Du 10/28/19 1403    Terrilee Files, MD 10/28/19 Mikle Bosworth

## 2019-10-29 LAB — NOVEL CORONAVIRUS, NAA (HOSP ORDER, SEND-OUT TO REF LAB; TAT 18-24 HRS): SARS-CoV-2, NAA: NOT DETECTED

## 2019-12-14 IMAGING — DX DG CHEST 1V PORT
1 series · 1 of 1 positions shown · non-contrast
Comparison: 07/04/2018

CLINICAL DATA: Cough, shortness of breath

EXAM:
PORTABLE CHEST 1 VIEW

[chest ap]
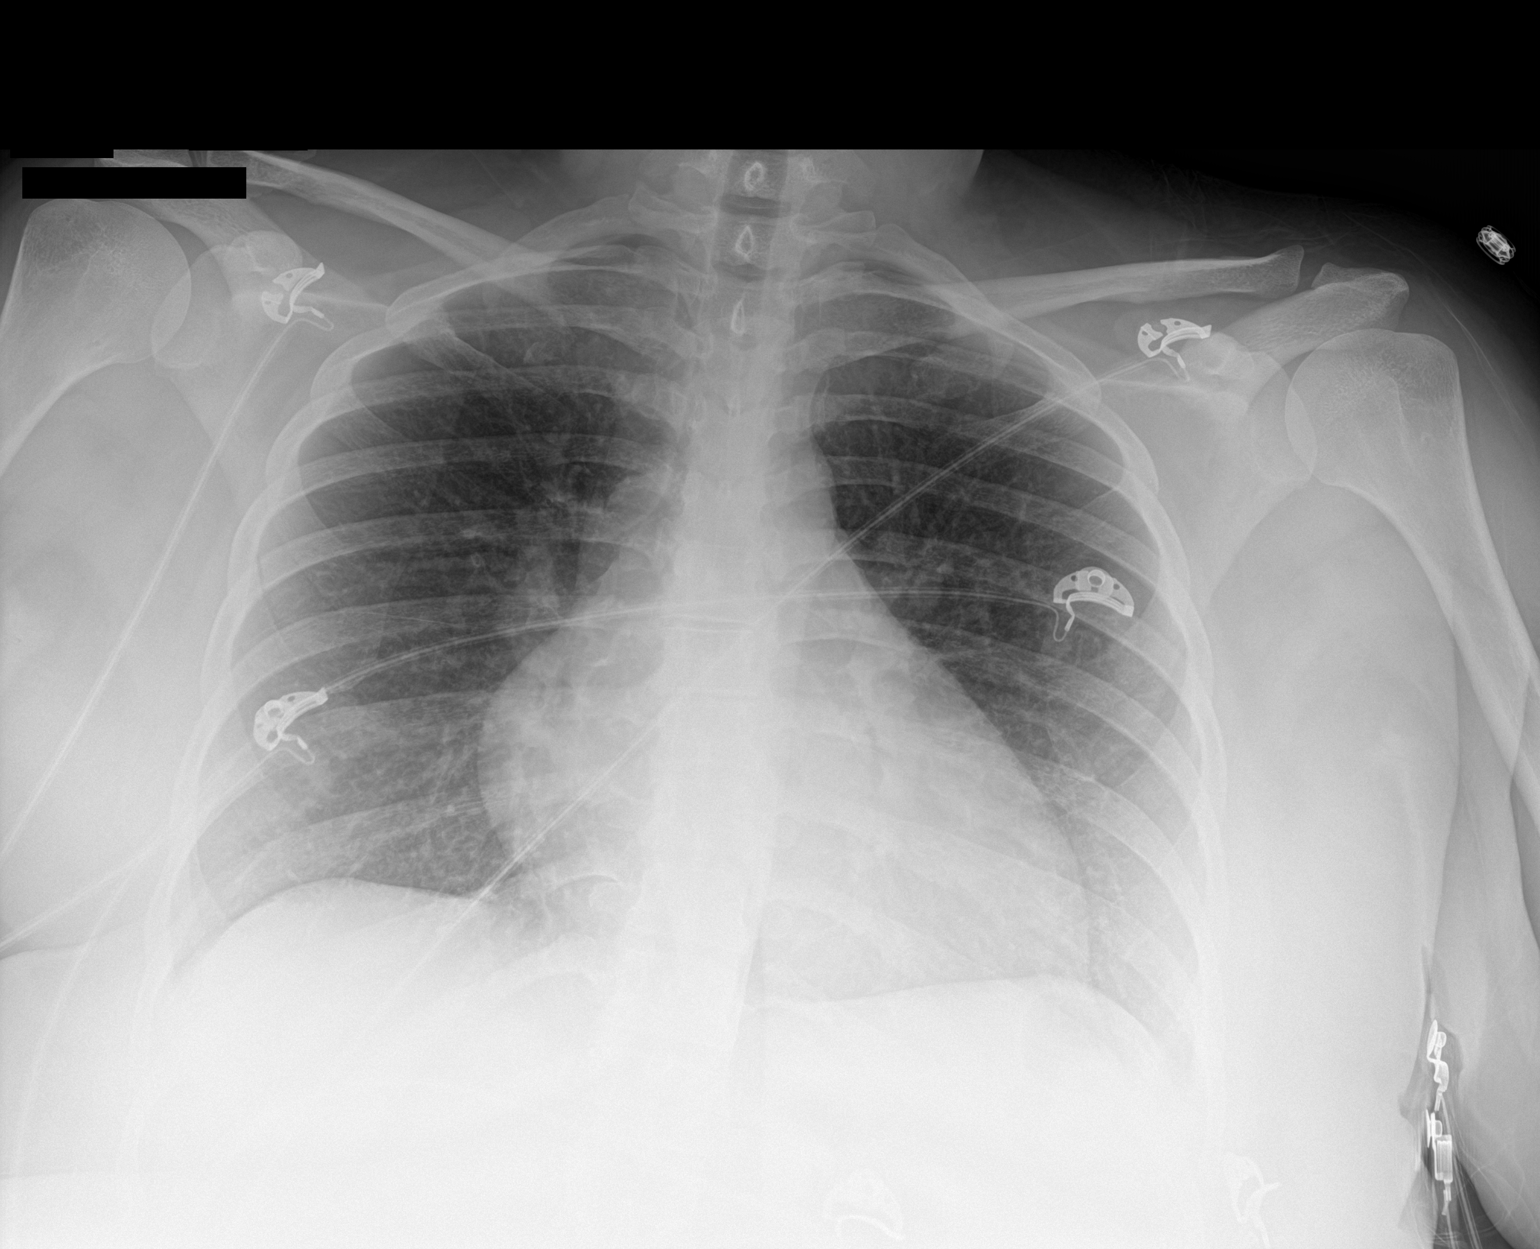

[1 of 1 positions shown; findings below may reference images not displayed]

FINDINGS: Mild cardiomegaly. Both lungs are clear. The visualized skeletal
structures are unremarkable.
IMPRESSION: Mild cardiomegaly without acute abnormality of the lungs in AP
portable projection.

## 2019-12-31 ENCOUNTER — Encounter (HOSPITAL_COMMUNITY): Payer: Self-pay | Admitting: Emergency Medicine

## 2019-12-31 ENCOUNTER — Other Ambulatory Visit: Payer: Self-pay

## 2019-12-31 ENCOUNTER — Emergency Department (HOSPITAL_COMMUNITY)
Admission: EM | Admit: 2019-12-31 | Discharge: 2020-01-01 | Disposition: A | Discharge status: Home / Self Care | Payer: Self-pay | Attending: Emergency Medicine | Admitting: Emergency Medicine

## 2019-12-31 DIAGNOSIS — R1110 Vomiting, unspecified: Secondary | ICD-10-CM | POA: Insufficient documentation

## 2019-12-31 DIAGNOSIS — Z5321 Procedure and treatment not carried out due to patient leaving prior to being seen by health care provider: Secondary | ICD-10-CM | POA: Insufficient documentation

## 2019-12-31 LAB — I-STAT BETA HCG BLOOD, ED (MC, WL, AP ONLY): I-stat hCG, quantitative: <5 m[IU]/mL (ref <5)

## 2019-12-31 LAB — COMPREHENSIVE METABOLIC PANEL
ALT: 13 U/L (ref 0–44)
AST: 15 U/L (ref 15–41)
Albumin: 3.7 g/dL (ref 3.5–5.0)
Alkaline Phosphatase: 46 U/L (ref 38–126)
Anion gap: 15 (ref 5–15)
BUN: 5 mg/dL — ABNORMAL LOW (ref 6–20)
CO2: 18 mmol/L — ABNORMAL LOW (ref 22–32)
Calcium: 9 mg/dL (ref 8.9–10.3)
Chloride: 108 mmol/L (ref 98–111)
Creatinine, Ser: 0.63 mg/dL (ref 0.44–1.00)
GFR calc Af Amer: >60 mL/min (ref >60)
GFR calc non Af Amer: >60 mL/min (ref >60)
Glucose, Bld: 93 mg/dL (ref 70–99)
Potassium: 3.9 mmol/L (ref 3.5–5.1)
Sodium: 141 mmol/L (ref 135–145)
Total Bilirubin: 0.6 mg/dL (ref 0.3–1.2)
Total Protein: 7.1 g/dL (ref 6.5–8.1)

## 2019-12-31 LAB — CBC
HCT: 38.7 % (ref 36.0–46.0)
Hemoglobin: 12.7 g/dL (ref 12.0–15.0)
MCH: 28 pg (ref 26.0–34.0)
MCHC: 32.8 g/dL (ref 30.0–36.0)
MCV: 85.4 fL (ref 80.0–100.0)
Platelets: 259 10*3/uL (ref 150–400)
RBC: 4.53 MIL/uL (ref 3.87–5.11)
RDW: 12.6 % (ref 11.5–15.5)
WBC: 5.9 10*3/uL (ref 4.0–10.5)
nRBC: 0 % (ref 0.0–0.2)

## 2019-12-31 LAB — LIPASE, BLOOD: Lipase: 28 U/L (ref 11–51)

## 2019-12-31 MED ORDER — SODIUM CHLORIDE 0.9% FLUSH: 3.0000 mL | Freq: Once | INTRAVENOUS | Status: DC

## 2019-12-31 NOTE — ED Triage Notes (Signed)
Patient reports multiple black emesis today , denies abdominal pain or diarrhea , no fever or chills.

## 2020-01-01 ENCOUNTER — Encounter (HOSPITAL_COMMUNITY): Payer: Self-pay

## 2020-01-01 ENCOUNTER — Emergency Department (HOSPITAL_COMMUNITY)
Admission: EM | Admit: 2020-01-01 | Discharge: 2020-01-01 | Disposition: A | Discharge status: Home / Self Care | Payer: Self-pay | Attending: Emergency Medicine | Admitting: Emergency Medicine

## 2020-01-01 ENCOUNTER — Emergency Department (HOSPITAL_COMMUNITY): Payer: Self-pay

## 2020-01-01 ENCOUNTER — Other Ambulatory Visit: Payer: Self-pay

## 2020-01-01 DIAGNOSIS — R519 Headache, unspecified: Secondary | ICD-10-CM | POA: Insufficient documentation

## 2020-01-01 DIAGNOSIS — Z791 Long term (current) use of non-steroidal anti-inflammatories (NSAID): Secondary | ICD-10-CM | POA: Insufficient documentation

## 2020-01-01 DIAGNOSIS — F17210 Nicotine dependence, cigarettes, uncomplicated: Secondary | ICD-10-CM | POA: Insufficient documentation

## 2020-01-01 DIAGNOSIS — K0889 Other specified disorders of teeth and supporting structures: Secondary | ICD-10-CM | POA: Insufficient documentation

## 2020-01-01 DIAGNOSIS — R0789 Other chest pain: Secondary | ICD-10-CM | POA: Insufficient documentation

## 2020-01-01 LAB — CBC
HCT: 38.4 % (ref 36.0–46.0)
Hemoglobin: 12.5 g/dL (ref 12.0–15.0)
MCH: 27.8 pg (ref 26.0–34.0)
MCHC: 32.6 g/dL (ref 30.0–36.0)
MCV: 85.3 fL (ref 80.0–100.0)
Platelets: 240 10*3/uL (ref 150–400)
RBC: 4.5 MIL/uL (ref 3.87–5.11)
RDW: 12.5 % (ref 11.5–15.5)
WBC: 5.7 10*3/uL (ref 4.0–10.5)
nRBC: 0 % (ref 0.0–0.2)

## 2020-01-01 LAB — BASIC METABOLIC PANEL
Anion gap: 8 (ref 5–15)
BUN: 10 mg/dL (ref 6–20)
CO2: 24 mmol/L (ref 22–32)
Calcium: 9.3 mg/dL (ref 8.9–10.3)
Chloride: 110 mmol/L (ref 98–111)
Creatinine, Ser: 0.55 mg/dL (ref 0.44–1.00)
GFR calc Af Amer: >60 mL/min (ref >60)
GFR calc non Af Amer: >60 mL/min (ref >60)
Glucose, Bld: 97 mg/dL (ref 70–99)
Potassium: 3.6 mmol/L (ref 3.5–5.1)
Sodium: 142 mmol/L (ref 135–145)

## 2020-01-01 LAB — I-STAT BETA HCG BLOOD, ED (NOT ORDERABLE): I-stat hCG, quantitative: <5 m[IU]/mL (ref <5)

## 2020-01-01 LAB — TROPONIN I (HIGH SENSITIVITY): Troponin I (High Sensitivity): <2 ng/L (ref <18)

## 2020-01-01 MED ORDER — SODIUM CHLORIDE 0.9% FLUSH: 3.0000 mL | Freq: Once | INTRAVENOUS | Status: DC

## 2020-01-01 MED ORDER — PENICILLIN V POTASSIUM 500 MG PO TABS: 500.0000 mg | ORAL_TABLET | Freq: Four times a day (QID) | ORAL | 0 refills | Status: AC

## 2020-01-01 NOTE — ED Notes (Signed)
Security trying to talk to pt. Pt continuously yelling and cussing. Per charge if pt cannot follow rules she can be escorted out.

## 2020-01-01 NOTE — ED Triage Notes (Signed)
Pt presents with c/o chest pain, vomiting black emesis, pain on the left side of her mouth radiating to the left side of her head.

## 2020-01-01 NOTE — ED Notes (Signed)
Save blue in main lab 

## 2020-01-01 NOTE — ED Notes (Signed)
Patient verbally abusive with staff at waiting area despite repeated explaining by nurse and EMT on process and delay.

## 2020-01-01 NOTE — ED Notes (Signed)
Pt talking on phone yelling and cussing stating how bad our services our.

## 2020-01-01 NOTE — ED Provider Notes (Signed)
[facility] DEPT Provider Note   CSN: [id number] Arrival date & time: 01/01/20  1215     History Chief Complaint  Patient presents with  . Chest Pain  . Emesis  . Dental Pain    Betty Thompson is a 34 y.o. female.  The history is provided by the patient and medical records. No language interpreter was used.   Betty Thompson is a 34 y.o. female who presents to the Emergency Department complaining of headache/pressure.  She has pain and pressure to the left side of her face for the last 3-4 days.  She believes the headache is related to a bad tooth.  The tooth pain went away last night.  Yesterday she developed nausea/dry heaves followed by vomiting gray/dark material.  She did have a grape soda earlier in the day.  She has pain from her epigastrum that radiates to her chest that started about four days ago.  She describes it as gas pain.     Denies fever, cough, loss of taste/smell, leg swelling. She has softer than usual stools but no diarrhea.   She presented to the emergency department at [facility] for evaluation yesterday but sat in the lobby for eight hours hello without being seen. She reviewed her labs in epic and noted that there were some abnormalities and she was concerned for diabetes and presents today for evaluation. Overall her tooth pain has resolved but she does still have a mild headache. She has a dental appointment on Tuesday.     Past Medical History:  Diagnosis Date  . Anxiety   . Depression     There are no problems to display for this patient.   Past Surgical History:  Procedure Laterality Date  . CESAREAN SECTION    . MOUTH SURGERY       OB History   No obstetric history on file.     History reviewed. No pertinent family history.  Social History   Tobacco Use  . Smoking status: Current Every Day Smoker    Types: Cigarettes  . Smokeless tobacco: Never Used  Substance Use Topics  . Alcohol use: No  . Drug use:  Yes    Frequency: 7.0 times per week    Types: Marijuana    Home Medications Prior to Admission medications   Medication Sig Start Date End Date Taking? Authorizing Provider  HYDROcodone-acetaminophen (NORCO/VICODIN) 5-325 MG tablet Take 1 tablet by mouth every 4 (four) hours as needed. 09/20/19   Fransico Meadow, PA-C  hydrOXYzine (ATARAX/VISTARIL) 25 MG tablet Take 1 tablet (25 mg total) by mouth every 6 (six) hours as needed for anxiety. 07/05/18   Larene Pickett, PA-C  ibuprofen (ADVIL) 800 MG tablet Take 1 tablet (800 mg total) by mouth 3 (three) times daily. 09/20/19   Fransico Meadow, PA-C    Allergies    Benadryl [diphenhydramine]  Review of Systems   Review of Systems  All other systems reviewed and are negative.   Physical Exam Updated Vital Signs BP 133/65 (BP Location: Left Arm)   Pulse 80   Temp 97.8 F (36.6 C) (Oral)   Resp 18   LMP 12/20/2019 (Approximate)   SpO2 100%   Physical Exam Vitals and nursing note reviewed.  Constitutional:      Appearance: She is well-developed.  HENT:     Head: Normocephalic and atraumatic.     Comments: TMs clear bilaterally. No significant gingival induration or erythema. There is tenderness over the left  maxillary sinus without erythema. Cardiovascular:     Rate and Rhythm: Normal rate and regular rhythm.     Heart sounds: No murmur.  Pulmonary:     Effort: Pulmonary effort is normal. No respiratory distress.     Breath sounds: Normal breath sounds.  Abdominal:     Palpations: Abdomen is soft.     Tenderness: There is no abdominal tenderness. There is no guarding or rebound.  Musculoskeletal:        General: No swelling.  Skin:    General: Skin is warm and dry.  Neurological:     Mental Status: She is alert and oriented to person, place, and time.     Comments: No asymmetry of facial movements. Moves all extremities symmetrically.  Psychiatric:        Behavior: Behavior normal.     ED Results / Procedures /  Treatments   Labs (all labs ordered are listed, but only abnormal results are displayed) Labs Reviewed  BASIC METABOLIC PANEL  CBC  I-STAT BETA HCG BLOOD, ED ([facility], [facility], [facility] ONLY)  I-STAT BETA HCG BLOOD, ED (NOT ORDERABLE)  TROPONIN I (HIGH SENSITIVITY)  TROPONIN I (HIGH SENSITIVITY)    EKG EKG Interpretation  Date/Time:  Saturday January 01 2020 12:38:51 EST Ventricular Rate:  87 PR Interval:    QRS Duration: 85 QT Interval:  351 QTC Calculation: 423 R Axis:   36 Text Interpretation: Sinus rhythm ST elev, probable normal early repol pattern similar when compared to prior on 09/20/19 Confirmed by Tilden Fossa [phone]) on 01/01/2020 5:09:55 PM   Radiology DG Chest 2 View  Result Date: 01/01/2020 CLINICAL DATA:  Chest pain EXAM: CHEST - 2 VIEW COMPARISON:  09/20/2019 FINDINGS: The heart size and mediastinal contours are within normal limits. Both lungs are clear. The visualized skeletal structures are unremarkable. IMPRESSION: No acute abnormality of the lungs. Electronically Signed   By: Lauralyn Primes M.D.   On: 01/01/2020 13:13    Procedures Procedures (including critical care time)  Medications Ordered in ED Medications  sodium chloride flush (NS) 0.9 % injection 3 mL (has no administration in time range)    ED Course  I have reviewed the triage vital signs and the nursing notes.  Pertinent labs & imaging results that were available during my care of the patient were reviewed by me and considered in my medical decision making (see chart for details).    MDM Rules/Calculators/A&P                     Patient here for evaluation of headache, dental pain, emesis. She is non-toxic appearing on evaluation. She has no focal neurologic deficits. Presentation is not consistent with sepsis, ACS, PE, dissection, subarachnoid hemorrhage, meningitis, major GI bleed. Discussed with patient home care for dental pain and headache, possible early pulpitis, will start on antibiotics. Discussed  outpatient follow-up and return precautions.  Final Clinical Impression(s) / ED Diagnoses Final diagnoses:  None    Rx / DC Orders ED Discharge Orders    None       Tilden Fossa, MD 01/01/20 [phone]

## 2020-01-01 NOTE — ED Notes (Signed)
Pt approached this tech and asked how long the wait was. This tech explained to pt the wait time. Pt stated "when I pass out out here yall gonna have a big ass law suit on your hand"

## 2020-05-19 ENCOUNTER — Emergency Department (HOSPITAL_COMMUNITY): Payer: Medicaid Other

## 2020-05-19 ENCOUNTER — Encounter (HOSPITAL_COMMUNITY): Payer: Self-pay | Admitting: Emergency Medicine

## 2020-05-19 ENCOUNTER — Emergency Department (HOSPITAL_COMMUNITY)
Admission: EM | Admit: 2020-05-19 | Discharge: 2020-05-19 | Disposition: A | Discharge status: Home / Self Care | Payer: Medicaid Other | Attending: Emergency Medicine | Admitting: Emergency Medicine

## 2020-05-19 DIAGNOSIS — Z5321 Procedure and treatment not carried out due to patient leaving prior to being seen by health care provider: Secondary | ICD-10-CM | POA: Insufficient documentation

## 2020-05-19 DIAGNOSIS — R05 Cough: Secondary | ICD-10-CM | POA: Diagnosis not present

## 2020-05-19 LAB — TROPONIN I (HIGH SENSITIVITY): Troponin I (High Sensitivity): <2 ng/L (ref <18)

## 2020-05-19 LAB — CBC
HCT: 38.2 % (ref 36.0–46.0)
Hemoglobin: 12.7 g/dL (ref 12.0–15.0)
MCH: 27.5 pg (ref 26.0–34.0)
MCHC: 33.2 g/dL (ref 30.0–36.0)
MCV: 82.9 fL (ref 80.0–100.0)
Platelets: 235 10*3/uL (ref 150–400)
RBC: 4.61 MIL/uL (ref 3.87–5.11)
RDW: 12.4 % (ref 11.5–15.5)
WBC: 4.2 10*3/uL (ref 4.0–10.5)
nRBC: 0 % (ref 0.0–0.2)

## 2020-05-19 LAB — BASIC METABOLIC PANEL
Anion gap: 10 (ref 5–15)
BUN: 8 mg/dL (ref 6–20)
CO2: 22 mmol/L (ref 22–32)
Calcium: 9.6 mg/dL (ref 8.9–10.3)
Chloride: 106 mmol/L (ref 98–111)
Creatinine, Ser: 0.59 mg/dL (ref 0.44–1.00)
GFR calc Af Amer: >60 mL/min (ref >60)
GFR calc non Af Amer: >60 mL/min (ref >60)
Glucose, Bld: 93 mg/dL (ref 70–99)
Potassium: 3.7 mmol/L (ref 3.5–5.1)
Sodium: 138 mmol/L (ref 135–145)

## 2020-05-19 LAB — I-STAT BETA HCG BLOOD, ED (MC, WL, AP ONLY): I-stat hCG, quantitative: <5 m[IU]/mL (ref <5)

## 2020-05-19 MED ORDER — SODIUM CHLORIDE 0.9% FLUSH: 3.0000 mL | Freq: Once | INTRAVENOUS | Status: DC

## 2020-05-19 NOTE — ED Triage Notes (Signed)
Pt states 3 days ago she woke up with a productive cough and chest tightness. Pt states she does smoke a pack of cigarettes a day. Pt had neg rapid covid test yesterday. Denies any fevers,

## 2020-05-19 NOTE — ED Notes (Signed)
Pt stating she has to go pick up her kids and is unable to stay any longer.

## 2020-07-26 ENCOUNTER — Other Ambulatory Visit: Payer: Self-pay

## 2020-07-26 ENCOUNTER — Encounter (HOSPITAL_COMMUNITY): Payer: Self-pay | Admitting: *Deleted

## 2020-07-26 ENCOUNTER — Emergency Department (HOSPITAL_COMMUNITY)
Admission: EM | Admit: 2020-07-26 | Discharge: 2020-07-27 | Disposition: A | Discharge status: Home / Self Care | Payer: Medicaid Other | Attending: Emergency Medicine | Admitting: Emergency Medicine

## 2020-07-26 DIAGNOSIS — Z5321 Procedure and treatment not carried out due to patient leaving prior to being seen by health care provider: Secondary | ICD-10-CM | POA: Insufficient documentation

## 2020-07-26 DIAGNOSIS — M546 Pain in thoracic spine: Secondary | ICD-10-CM | POA: Diagnosis not present

## 2020-07-26 DIAGNOSIS — Z1152 Encounter for screening for COVID-19: Secondary | ICD-10-CM

## 2020-07-26 MED ORDER — OXYCODONE-ACETAMINOPHEN 5-325 MG PO TABS
1.0000 | ORAL_TABLET | ORAL | Status: DC | PRN
  Administered 2020-07-26: 1 via ORAL
  Filled 2020-07-26: qty 1

## 2020-07-26 NOTE — ED Notes (Addendum)
NAX3

## 2020-07-26 NOTE — ED Notes (Signed)
called pt no answer

## 2020-07-26 NOTE — ED Triage Notes (Signed)
Pt arrived by [facility] for mid back pain that started yesterday, denies any injury. No hx of same.

## 2020-08-24 ENCOUNTER — Encounter (HOSPITAL_COMMUNITY): Payer: Self-pay | Admitting: Emergency Medicine

## 2020-08-24 ENCOUNTER — Other Ambulatory Visit: Payer: Self-pay

## 2020-08-24 ENCOUNTER — Ambulatory Visit (HOSPITAL_COMMUNITY)
Admission: EM | Admit: 2020-08-24 | Discharge: 2020-08-24 | Disposition: A | Discharge status: Home / Self Care | Payer: Medicaid Other | Attending: Internal Medicine | Admitting: Internal Medicine

## 2020-08-24 DIAGNOSIS — Z20822 Contact with and (suspected) exposure to covid-19: Secondary | ICD-10-CM | POA: Diagnosis present

## 2020-08-24 LAB — SARS CORONAVIRUS 2 (TAT 6-24 HRS): SARS Coronavirus 2: NEGATIVE

## 2020-08-24 NOTE — ED Triage Notes (Signed)
Pt presents for covid test no symptoms.  °

## 2020-08-24 NOTE — Discharge Instructions (Signed)

## 2020-08-30 ENCOUNTER — Encounter (HOSPITAL_COMMUNITY): Payer: Self-pay

## 2020-08-30 ENCOUNTER — Ambulatory Visit (HOSPITAL_COMMUNITY)
Admission: EM | Admit: 2020-08-30 | Discharge: 2020-08-30 | Disposition: A | Discharge status: Home / Self Care | Payer: Medicaid Other | Attending: Family Medicine | Admitting: Family Medicine

## 2020-08-30 ENCOUNTER — Other Ambulatory Visit: Payer: Self-pay

## 2020-08-30 DIAGNOSIS — J069 Acute upper respiratory infection, unspecified: Secondary | ICD-10-CM | POA: Insufficient documentation

## 2020-08-30 DIAGNOSIS — R0981 Nasal congestion: Secondary | ICD-10-CM | POA: Diagnosis not present

## 2020-08-30 DIAGNOSIS — Z888 Allergy status to other drugs, medicaments and biological substances status: Secondary | ICD-10-CM | POA: Diagnosis not present

## 2020-08-30 DIAGNOSIS — Z79899 Other long term (current) drug therapy: Secondary | ICD-10-CM | POA: Diagnosis not present

## 2020-08-30 DIAGNOSIS — J029 Acute pharyngitis, unspecified: Secondary | ICD-10-CM | POA: Diagnosis not present

## 2020-08-30 DIAGNOSIS — R067 Sneezing: Secondary | ICD-10-CM | POA: Insufficient documentation

## 2020-08-30 DIAGNOSIS — Z20822 Contact with and (suspected) exposure to covid-19: Secondary | ICD-10-CM | POA: Insufficient documentation

## 2020-08-30 DIAGNOSIS — Z791 Long term (current) use of non-steroidal anti-inflammatories (NSAID): Secondary | ICD-10-CM | POA: Insufficient documentation

## 2020-08-30 DIAGNOSIS — F17210 Nicotine dependence, cigarettes, uncomplicated: Secondary | ICD-10-CM | POA: Insufficient documentation

## 2020-08-30 DIAGNOSIS — F419 Anxiety disorder, unspecified: Secondary | ICD-10-CM | POA: Insufficient documentation

## 2020-08-30 LAB — POCT RAPID STREP A, ED / UC: Streptococcus, Group A Screen (Direct): NEGATIVE

## 2020-08-30 MED ORDER — CETIRIZINE HCL 10 MG PO TABS: 10.0000 mg | ORAL_TABLET | Freq: Every day | ORAL | 0 refills | Status: DC

## 2020-08-30 MED ORDER — PROMETHAZINE-DM 6.25-15 MG/5ML PO SYRP
5.0000 mL | ORAL_SOLUTION | Freq: Every evening | ORAL | 0 refills | Status: DC | PRN
Start: 2020-08-30 — End: 2021-02-16

## 2020-08-30 MED ORDER — BENZONATATE 100 MG PO CAPS: 100.0000 mg | ORAL_CAPSULE | Freq: Three times a day (TID) | ORAL | 0 refills | Status: DC | PRN

## 2020-08-30 MED ORDER — PSEUDOEPHEDRINE HCL 60 MG PO TABS
60.0000 mg | ORAL_TABLET | Freq: Three times a day (TID) | ORAL | 0 refills | Status: DC | PRN
Start: 2020-08-30 — End: 2021-02-16

## 2020-08-30 NOTE — ED Triage Notes (Addendum)
Pt is here with a cough, sore throat with a on & off headache, nasal congestion that started Monday, pt has not taken any meds to relieve discomfort.

## 2020-08-30 NOTE — Discharge Instructions (Signed)

## 2020-08-30 NOTE — ED Provider Notes (Signed)
[facility]   MRN: [id number] DOB: 1986-08-26  Subjective:   Betty Thompson is a 34 y.o. female presenting for 3-day history of sinus congestion, throat pain, cough, sneezing.  Patient had indirect COVID-19 exposure through her children who go to school and there was an outbreak there.  Patient got tested on 08/24/2020, the same day she was supposedly exposed.  She was later advised that she should have waited and therefore would like to get tested today as she has since developed symptoms.  No current facility-administered medications for this encounter.  Current Outpatient Medications:  .  HYDROcodone-acetaminophen (NORCO/VICODIN) 5-325 MG tablet, Take 1 tablet by mouth every 4 (four) hours as needed., Disp: 10 tablet, Rfl: 0 .  hydrOXYzine (ATARAX/VISTARIL) 25 MG tablet, Take 1 tablet (25 mg total) by mouth every 6 (six) hours as needed for anxiety., Disp: 12 tablet, Rfl: 0 .  ibuprofen (ADVIL) 800 MG tablet, Take 1 tablet (800 mg total) by mouth 3 (three) times daily., Disp: 21 tablet, Rfl: 0   Allergies  Allergen Reactions  . Benadryl [Diphenhydramine] Swelling    Past Medical History:  Diagnosis Date  . Anxiety   . Depression      Past Surgical History:  Procedure Laterality Date  . CESAREAN SECTION    . MOUTH SURGERY      Family History  Family history unknown: Yes    Social History   Tobacco Use  . Smoking status: Current Every Day Smoker    Types: Cigarettes  . Smokeless tobacco: Never Used  Substance Use Topics  . Alcohol use: No  . Drug use: Yes    Frequency: 7.0 times per week    Types: Marijuana    ROS   Objective:   Vitals: BP 120/80 (BP Location: Right Arm)   Pulse 86   Temp 98.8 F (37.1 C) (Oral)   Resp 19   LMP 08/13/2020   SpO2 94%   Physical Exam Constitutional:      General: She is not in acute distress.    Appearance: Normal appearance. She is well-developed. She is not ill-appearing, toxic-appearing or diaphoretic.    HENT:     Head: Normocephalic and atraumatic.     Nose: Congestion and rhinorrhea present.     Mouth/Throat:     Mouth: Mucous membranes are moist.  Eyes:     Extraocular Movements: Extraocular movements intact.     Pupils: Pupils are equal, round, and reactive to light.  Cardiovascular:     Rate and Rhythm: Normal rate and regular rhythm.     Pulses: Normal pulses.     Heart sounds: Normal heart sounds. No murmur heard.  No friction rub. No gallop.   Pulmonary:     Effort: Pulmonary effort is normal. No respiratory distress.     Breath sounds: Normal breath sounds. No stridor. No wheezing, rhonchi or rales.  Skin:    General: Skin is warm and dry.     Findings: No rash.  Neurological:     Mental Status: She is alert and oriented to person, place, and time.  Psychiatric:        Mood and Affect: Mood normal.        Behavior: Behavior normal.        Thought Content: Thought content normal.     Results for orders placed or performed during the hospital encounter of 08/30/20 (from the past 24 hour(s))  POCT Rapid Strep A     Status: None   Collection  Time: 08/30/20  2:27 PM  Result Value Ref Range   Streptococcus, Group A Screen (Direct) NEGATIVE NEGATIVE    Assessment and Plan :   PDMP not reviewed this encounter.  1. Viral URI with cough   2. Sore throat   3. Nasal congestion   4. Sneezing     Will manage for viral illness such as viral URI, viral syndrome, viral rhinitis, COVID-19. Counseled patient on nature of COVID-19 including modes of transmission, diagnostic testing, management and supportive care.  Offered scripts for symptomatic relief. COVID 19 testing is pending. Counseled patient on potential for adverse effects with medications prescribed/recommended today, ER and return-to-clinic precautions discussed, patient verbalized understanding.     Wallis Bamberg, PA-C 08/30/20 1442

## 2020-08-31 LAB — SARS CORONAVIRUS 2 (TAT 6-24 HRS): SARS Coronavirus 2: NEGATIVE

## 2020-09-02 LAB — CULTURE, GROUP A STREP (THRC)

## 2020-12-27 ENCOUNTER — Other Ambulatory Visit: Payer: Self-pay

## 2020-12-27 ENCOUNTER — Emergency Department (HOSPITAL_COMMUNITY): Payer: Medicaid Other

## 2020-12-27 ENCOUNTER — Encounter (HOSPITAL_COMMUNITY): Payer: Self-pay | Admitting: Emergency Medicine

## 2020-12-27 ENCOUNTER — Emergency Department (HOSPITAL_COMMUNITY)
Admission: EM | Admit: 2020-12-27 | Discharge: 2020-12-27 | Disposition: A | Discharge status: Home / Self Care | Payer: Medicaid Other | Attending: Emergency Medicine | Admitting: Emergency Medicine

## 2020-12-27 DIAGNOSIS — R0789 Other chest pain: Secondary | ICD-10-CM | POA: Diagnosis not present

## 2020-12-27 DIAGNOSIS — R1012 Left upper quadrant pain: Secondary | ICD-10-CM | POA: Insufficient documentation

## 2020-12-27 DIAGNOSIS — F17210 Nicotine dependence, cigarettes, uncomplicated: Secondary | ICD-10-CM | POA: Insufficient documentation

## 2020-12-27 DIAGNOSIS — M79602 Pain in left arm: Secondary | ICD-10-CM | POA: Diagnosis not present

## 2020-12-27 DIAGNOSIS — R079 Chest pain, unspecified: Secondary | ICD-10-CM | POA: Diagnosis present

## 2020-12-27 LAB — CBC
HCT: 36.6 % (ref 36.0–46.0)
Hemoglobin: 11.8 g/dL — ABNORMAL LOW (ref 12.0–15.0)
MCH: 26 pg (ref 26.0–34.0)
MCHC: 32.2 g/dL (ref 30.0–36.0)
MCV: 80.6 fL (ref 80.0–100.0)
Platelets: 251 10*3/uL (ref 150–400)
RBC: 4.54 MIL/uL (ref 3.87–5.11)
RDW: 12.6 % (ref 11.5–15.5)
WBC: 5 10*3/uL (ref 4.0–10.5)
nRBC: 0 % (ref 0.0–0.2)

## 2020-12-27 LAB — BASIC METABOLIC PANEL
Anion gap: 8 (ref 5–15)
BUN: 7 mg/dL (ref 6–20)
CO2: 21 mmol/L — ABNORMAL LOW (ref 22–32)
Calcium: 9 mg/dL (ref 8.9–10.3)
Chloride: 110 mmol/L (ref 98–111)
Creatinine, Ser: 0.55 mg/dL (ref 0.44–1.00)
GFR, Estimated: >60 mL/min (ref >60)
Glucose, Bld: 98 mg/dL (ref 70–99)
Potassium: 3.9 mmol/L (ref 3.5–5.1)
Sodium: 139 mmol/L (ref 135–145)

## 2020-12-27 LAB — I-STAT BETA HCG BLOOD, ED (MC, WL, AP ONLY): I-stat hCG, quantitative: <5 m[IU]/mL (ref <5)

## 2020-12-27 LAB — HEPATIC FUNCTION PANEL
ALT: 13 U/L (ref 0–44)
AST: 17 U/L (ref 15–41)
Albumin: 3.2 g/dL — ABNORMAL LOW (ref 3.5–5.0)
Alkaline Phosphatase: 62 U/L (ref 38–126)
Bilirubin, Direct: 0.1 mg/dL (ref 0.0–0.2)
Indirect Bilirubin: 0.3 mg/dL (ref 0.3–0.9)
Total Bilirubin: 0.4 mg/dL (ref 0.3–1.2)
Total Protein: 6.4 g/dL — ABNORMAL LOW (ref 6.5–8.1)

## 2020-12-27 LAB — LIPASE, BLOOD: Lipase: 35 U/L (ref 11–51)

## 2020-12-27 LAB — TROPONIN I (HIGH SENSITIVITY)
Troponin I (High Sensitivity): <2 ng/L (ref <18)
Troponin I (High Sensitivity): <2 ng/L (ref <18)

## 2020-12-27 LAB — D-DIMER, QUANTITATIVE: D-Dimer, Quant: 0.29 ug/mL-FEU (ref 0.00–0.50)

## 2020-12-27 MED ORDER — ALUM & MAG HYDROXIDE-SIMETH 200-200-20 MG/5ML PO SUSP
30.0000 mL | Freq: Once | ORAL | Status: AC
  Administered 2020-12-27: 12:00:00 30 mL via ORAL
  Filled 2020-12-27: qty 30

## 2020-12-27 MED ORDER — LIDOCAINE VISCOUS HCL 2 % MT SOLN
15.0000 mL | Freq: Once | OROMUCOSAL | Status: AC
  Administered 2020-12-27: 12:00:00 15 mL via ORAL
  Filled 2020-12-27: qty 15

## 2020-12-27 NOTE — ED Provider Notes (Signed)
[facility] EMERGENCY DEPARTMENT Provider Note   CSN: [id number] Arrival date & time: 12/27/20  0844     History Chief Complaint  Patient presents with  . Chest Pain    Betty Thompson is a 34 y.o. female with PMHx anxiety and depression who presents to the ED Today with complaint of gradual onset, constant, waxing and waning, sharp, left sided chest pain with radiation down left arm that began last night around 10 PM.  Patient reports she noticed a mild pain in this area and assumed that she was having gas.  She states she took a over-the-counter medication to help her have a bowel movement which seemed to relieve the pain somewhat however the pain became worse shortly afterwards and has been consistent since then.  She states the pain is worse when she takes a deep breath in.  She has never had pain like this before.  She does mention that for the past week at work she has noticed that she feels more short of breath while wearing her mask.  Patient denies any recent sick contacts, is unvaccinated against COVID-19.  Does report that her father started having heart attacks in his 52s.  Patient is a current active smoker, has been smoking since the age of 21 and states she typically goes through 1 pack of cigarettes every 3 to 4 days.  Patient denies any history of DVT/PE.  No recent prolonged travel or immobilization.  No hemoptysis.  No active malignancy.  No exogenous hormone use.  Patient denies fevers, chills, cough, leg swelling, palpitations, nausea, vomiting, any other associated symptoms.  The history is provided by the patient and medical records.       Past Medical History:  Diagnosis Date  . Anxiety   . Depression     There are no problems to display for this patient.   Past Surgical History:  Procedure Laterality Date  . CESAREAN SECTION    . MOUTH SURGERY       OB History   No obstetric history on file.     Family History  Family history  unknown: Yes    Social History   Tobacco Use  . Smoking status: Current Every Day Smoker    Types: Cigarettes  . Smokeless tobacco: Never Used  Substance Use Topics  . Alcohol use: No  . Drug use: Yes    Frequency: 7.0 times per week    Types: Marijuana    Home Medications Prior to Admission medications   Medication Sig Start Date End Date Taking? Authorizing Provider  benzonatate (TESSALON) 100 MG capsule Take 1-2 capsules (100-200 mg total) by mouth 3 (three) times daily as needed. 08/30/20   Wallis Bamberg, PA-C  cetirizine (ZYRTEC ALLERGY) 10 MG tablet Take 1 tablet (10 mg total) by mouth daily. 08/30/20   Wallis Bamberg, PA-C  HYDROcodone-acetaminophen (NORCO/VICODIN) 5-325 MG tablet Take 1 tablet by mouth every 4 (four) hours as needed. 09/20/19   Elson Areas, PA-C  hydrOXYzine (ATARAX/VISTARIL) 25 MG tablet Take 1 tablet (25 mg total) by mouth every 6 (six) hours as needed for anxiety. 07/05/18   Garlon Hatchet, PA-C  ibuprofen (ADVIL) 800 MG tablet Take 1 tablet (800 mg total) by mouth 3 (three) times daily. 09/20/19   Elson Areas, PA-C  promethazine-dextromethorphan (PROMETHAZINE-DM) 6.25-15 MG/5ML syrup Take 5 mLs by mouth at bedtime as needed for cough. 08/30/20   Wallis Bamberg, PA-C  pseudoephedrine (SUDAFED) 60 MG tablet Take 1 tablet (60  mg total) by mouth every 8 (eight) hours as needed for congestion. 08/30/20   Wallis Bamberg, PA-C    Allergies    Benadryl [diphenhydramine]  Review of Systems   Review of Systems  Constitutional: Negative for chills and fever.  Respiratory: Negative for cough and shortness of breath.   Cardiovascular: Positive for chest pain. Negative for palpitations and leg swelling.  Gastrointestinal: Negative for nausea and vomiting.  All other systems reviewed and are negative.   Physical Exam Updated Vital Signs BP (!) 104/57 (BP Location: Left Arm)   Pulse 76   Temp 98 F (36.7 C) (Oral)   Resp 18   Ht 5\' 1"  (1.549 m)   Wt 82.1 kg   SpO2 100%    BMI 34.20 kg/m   Physical Exam Vitals and nursing note reviewed.  Constitutional:      Appearance: She is not ill-appearing or diaphoretic.  HENT:     Head: Normocephalic and atraumatic.  Eyes:     Conjunctiva/sclera: Conjunctivae normal.  Cardiovascular:     Rate and Rhythm: Normal rate and regular rhythm.     Pulses:          Radial pulses are 2+ on the right side and 2+ on the left side.       Dorsalis pedis pulses are 2+ on the right side and 2+ on the left side.     Heart sounds: Normal heart sounds.  Pulmonary:     Effort: Pulmonary effort is normal.     Breath sounds: Normal breath sounds. No decreased breath sounds, wheezing, rhonchi or rales.  Chest:     Chest wall: Tenderness present.     Comments: + diffuse left sided chest wall TTP Abdominal:     Palpations: Abdomen is soft.     Tenderness: There is abdominal tenderness. There is no guarding or rebound.     Comments: Soft, + LUQ abdominal TTP, +BS throughout, no r/g/r, neg murphy's, neg mcburney's, no CVA TTP  Musculoskeletal:     Cervical back: Neck supple.  Skin:    General: Skin is warm and dry.  Neurological:     Mental Status: She is alert.     ED Results / Procedures / Treatments   Labs (all labs ordered are listed, but only abnormal results are displayed) Labs Reviewed  BASIC METABOLIC PANEL - Abnormal; Notable for the following components:      Result Value   CO2 21 (*)    All other components within normal limits  CBC - Abnormal; Notable for the following components:   Hemoglobin 11.8 (*)    All other components within normal limits  HEPATIC FUNCTION PANEL - Abnormal; Notable for the following components:   Total Protein 6.4 (*)    Albumin 3.2 (*)    All other components within normal limits  D-DIMER, QUANTITATIVE (NOT AT [facility])  LIPASE, BLOOD  I-STAT BETA HCG BLOOD, ED ([facility], [facility], [facility] ONLY)  TROPONIN I (HIGH SENSITIVITY)  TROPONIN I (HIGH SENSITIVITY)    EKG EKG  Interpretation  Date/Time:  Wednesday December 27 2020 08:55:44 EST Ventricular Rate:  77 PR Interval:  140 QRS Duration: 88 QT Interval:  370 QTC Calculation: 418 R Axis:   43 Text Interpretation: Normal sinus rhythm Normal ECG No significant change since last tracing -likely benign early repolarization Confirmed by 10-26-2003 (Alvira Monday) on 12/27/2020 11:20:44 AM   Radiology DG Chest 2 View  Result Date: 12/27/2020 CLINICAL DATA:  Chest pain EXAM: CHEST - 2  VIEW COMPARISON:  May 19, 2020 FINDINGS: Lungs are clear. Heart size and pulmonary vascularity are normal. No adenopathy. No pneumothorax. No bone lesions. IMPRESSION: Lungs clear.  Cardiac silhouette normal. Electronically Signed   By: Bretta Bang III M.D.   On: 12/27/2020 11:53    Procedures Procedures (including critical care time)  Medications Ordered in ED Medications  alum & mag hydroxide-simeth (MAALOX/MYLANTA) 200-200-20 MG/5ML suspension 30 mL (30 mLs Oral Given 12/27/20 1212)    And  lidocaine (XYLOCAINE) 2 % viscous mouth solution 15 mL (15 mLs Oral Given 12/27/20 1212)    ED Course  I have reviewed the triage vital signs and the nursing notes.  Pertinent labs & imaging results that were available during my care of the patient were reviewed by me and considered in my medical decision making (see chart for details).  Clinical Course as of 12/27/20 1355  Wed Dec 27, 2020  1314 D-Dimer, Quant: 0.29 [MV]    Clinical Course User Index [MV] Tanda Rockers, PA-C   MDM Rules/Calculators/A&P                          35 year old female presents to the ED today complaining of atraumatic left-sided chest pain with radiation down left arm began last night.  Pleuritic in nature.  Positive history of smoking and positive family history of CAD.  On arrival to the ED patient is afebrile, nontachycardic nontachypneic.  She had an EKG done while in the waiting room not show any acute ischemic changes.  She had lab  work done including a negative troponin.  She does not have a chest x-ray done.  On exam she has diffuse chest wall tenderness palpation along the left side as well as left upper quadrant tenderness palpation.  She denies any nausea or vomiting.  No history of heavy alcohol use.  She has no risk factors for PE however is complaining of pleuritic chest pain.  We will plan to add on D-dimer, LFTs, lipase at this time.  We will also obtain chest x-ray.  Given she is having abdominal pain with chest pain will also give a GI cocktail and see if this does not help.   CXR clear D dimer negative at 0.29. Low wells risk; low suspicion for PE Lipase 35 LFTs unremarkable And repeat troponin < 2.   On reevaluation pt sitting upright eating food. She states no improvement after the GI cocktail. Given she is tender throughout her chest wall will treat for musculoskeletal type pain today with Ibuprofen and Tylenol. Pt instructed to follow up with her PCP for same. She is in agreement with plan and stable for discharge home.   This note was prepared using Dragon voice recognition software and may include unintentional dictation errors due to the inherent limitations of voice recognition software.  Final Clinical Impression(s) / ED Diagnoses Final diagnoses:  Chest wall pain    Rx / DC Orders ED Discharge Orders    None       Discharge Instructions     Your workup in the ED was reassuring today. Your symptoms may be related to chest wall pain. Please take Ibuprofen and Tylenol as needed for the pain.   Follow up with your PCP regarding your ED visit today. If you do not have one you can follow up with [facility] for primary care needs.   Return to the ED for any worsening symptoms.  Tanda RockersVenter, Kilo Eshelman, PA-C 12/27/20 1356    Alvira MondaySchlossman, Erin, MD 12/27/20 [phone]

## 2020-12-27 NOTE — Discharge Instructions (Addendum)
Your workup in the ED was reassuring today. Your symptoms may be related to chest wall pain. Please take Ibuprofen and Tylenol as needed for the pain.   Follow up with your PCP regarding your ED visit today. If you do not have one you can follow up with [facility] for primary care needs.   Return to the ED for any worsening symptoms.

## 2020-12-27 NOTE — ED Notes (Signed)
Pt eating and drinking comfortably 

## 2020-12-27 NOTE — ED Notes (Signed)
Pt called me over asking what else she was waiting on, I informed her we were waiting on her blood work. Pt said she was hungry and ran out of her own snacks and would like a sandwich. However pt been eating her through the whole ED process. Darlin Priestly was provided.

## 2020-12-27 NOTE — ED Triage Notes (Signed)
Pt arrives to ED with c/o of left sided chest pain that started last night. The pain started when she was laying flat. Pain is sharp and radiates to neck and left arm. Associated symtopms include nausea. No SOB or abdominal pain. No fevers or chills.

## 2021-01-30 ENCOUNTER — Encounter (HOSPITAL_COMMUNITY): Payer: Self-pay

## 2021-01-30 ENCOUNTER — Other Ambulatory Visit: Payer: Self-pay

## 2021-01-30 ENCOUNTER — Ambulatory Visit (HOSPITAL_COMMUNITY)
Admission: EM | Admit: 2021-01-30 | Discharge: 2021-01-30 | Disposition: A | Discharge status: Home / Self Care | Payer: Medicaid Other | Attending: Internal Medicine | Admitting: Internal Medicine

## 2021-01-30 DIAGNOSIS — F17210 Nicotine dependence, cigarettes, uncomplicated: Secondary | ICD-10-CM | POA: Insufficient documentation

## 2021-01-30 DIAGNOSIS — R079 Chest pain, unspecified: Secondary | ICD-10-CM | POA: Insufficient documentation

## 2021-01-30 DIAGNOSIS — J069 Acute upper respiratory infection, unspecified: Secondary | ICD-10-CM | POA: Diagnosis not present

## 2021-01-30 DIAGNOSIS — Z888 Allergy status to other drugs, medicaments and biological substances status: Secondary | ICD-10-CM | POA: Diagnosis not present

## 2021-01-30 DIAGNOSIS — Z791 Long term (current) use of non-steroidal anti-inflammatories (NSAID): Secondary | ICD-10-CM | POA: Insufficient documentation

## 2021-01-30 DIAGNOSIS — Z20822 Contact with and (suspected) exposure to covid-19: Secondary | ICD-10-CM | POA: Insufficient documentation

## 2021-01-30 DIAGNOSIS — J029 Acute pharyngitis, unspecified: Secondary | ICD-10-CM | POA: Diagnosis present

## 2021-01-30 DIAGNOSIS — Z79899 Other long term (current) drug therapy: Secondary | ICD-10-CM | POA: Diagnosis not present

## 2021-01-30 NOTE — ED Triage Notes (Signed)
Pt c/o swollen tonsils, neck stiffness, chest pain, nasal congestion, dry cough and SOB x 2 days. Pt states she feels SOB when she is walking around. Pt states her chest is in pain when she coughs.

## 2021-01-30 NOTE — ED Provider Notes (Signed)
[facility]    CSN: [id number] Arrival date & time: 01/30/21  1443      History   Chief Complaint Chief Complaint  Patient presents with  . Sore Throat  . Cough  . Generalized Body Aches  . Shortness of Breath  . Chest Pain  . Fatigue  . Nasal Congestion    HPI Betty Thompson is a 35 y.o. female.   Presents with dry cough causing pain in chest, nasal congestion and drainage, swollen tonsils without sore throat, diarrhea and headache starting Sunday without improvement. No known sick contact. Not vaccinated. No pertinent medical history.   Past Medical History:  Diagnosis Date  . Anxiety   . Depression     There are no problems to display for this patient.   Past Surgical History:  Procedure Laterality Date  . CESAREAN SECTION    . MOUTH SURGERY      OB History   No obstetric history on file.      Home Medications    Prior to Admission medications   Medication Sig Start Date End Date Taking? Authorizing Provider  benzonatate (TESSALON) 100 MG capsule Take 1-2 capsules (100-200 mg total) by mouth 3 (three) times daily as needed. 08/30/20   Wallis Bamberg, PA-C  cetirizine (ZYRTEC ALLERGY) 10 MG tablet Take 1 tablet (10 mg total) by mouth daily. 08/30/20   Wallis Bamberg, PA-C  HYDROcodone-acetaminophen (NORCO/VICODIN) 5-325 MG tablet Take 1 tablet by mouth every 4 (four) hours as needed. 09/20/19   Elson Areas, PA-C  hydrOXYzine (ATARAX/VISTARIL) 25 MG tablet Take 1 tablet (25 mg total) by mouth every 6 (six) hours as needed for anxiety. 07/05/18   Garlon Hatchet, PA-C  ibuprofen (ADVIL) 800 MG tablet Take 1 tablet (800 mg total) by mouth 3 (three) times daily. 09/20/19   Elson Areas, PA-C  promethazine-dextromethorphan (PROMETHAZINE-DM) 6.25-15 MG/5ML syrup Take 5 mLs by mouth at bedtime as needed for cough. 08/30/20   Wallis Bamberg, PA-C  pseudoephedrine (SUDAFED) 60 MG tablet Take 1 tablet (60 mg total) by mouth every 8 (eight) hours as needed for  congestion. 08/30/20   Wallis Bamberg, PA-C    Family History Family History  Family history unknown: Yes    Social History Social History   Tobacco Use  . Smoking status: Current Every Day Smoker    Types: Cigarettes  . Smokeless tobacco: Never Used  Substance Use Topics  . Alcohol use: No  . Drug use: Yes    Frequency: 7.0 times per week    Types: Marijuana     Allergies   Benadryl [diphenhydramine]   Review of Systems Review of Systems  Constitutional: Negative.   HENT: Positive for congestion, postnasal drip, rhinorrhea and sinus pain. Negative for dental problem, drooling, ear discharge, ear pain, facial swelling, mouth sores, nosebleeds, sinus pressure, sneezing, sore throat, tinnitus, trouble swallowing and voice change.   Eyes: Negative.   Respiratory: Positive for cough. Negative for apnea, chest tightness, shortness of breath, wheezing and stridor.   Cardiovascular: Negative for chest pain.  Gastrointestinal: Positive for diarrhea. Negative for abdominal distention, abdominal pain, nausea and vomiting.  Neurological: Positive for headaches. Negative for dizziness, speech difficulty, weakness, light-headedness and numbness.  Psychiatric/Behavioral: Negative.      Physical Exam Triage Vital Signs ED Triage Vitals  Enc Vitals Group     BP 01/30/21 1519 121/72     Pulse Rate 01/30/21 1519 100     Resp 01/30/21 1519 17  Temp 01/30/21 1519 97.9 F (36.6 C)     Temp Source 01/30/21 1519 Oral     SpO2 01/30/21 1519 100 %     Weight --      Height --      Head Circumference --      Peak Flow --      Pain Score 01/30/21 1518 5     Pain Loc --      Pain Edu? --      Excl. in GC? --    No data found.  Updated Vital Signs BP 121/72 (BP Location: Right Arm)   Pulse 100   Temp 97.9 F (36.6 C) (Oral)   Resp 17   LMP 01/29/2021 (Exact Date)   SpO2 100%   Visual Acuity Right Eye Distance:   Left Eye Distance:   Bilateral Distance:    Right Eye Near:    Left Eye Near:    Bilateral Near:     Physical Exam Constitutional:      Appearance: She is well-developed and normal weight.  HENT:     Head: Normocephalic.     Right Ear: Tympanic membrane normal.     Left Ear: Tympanic membrane normal.     Nose: Congestion and rhinorrhea present. Rhinorrhea is clear.     Right Turbinates: Swollen and pale.     Left Turbinates: Swollen and pale.     Mouth/Throat:     Mouth: Mucous membranes are moist. No oral lesions.     Pharynx: Oropharynx is clear. Uvula midline. Posterior oropharyngeal erythema present. No pharyngeal swelling or oropharyngeal exudate.     Tonsils: No tonsillar exudate or tonsillar abscesses. 2+ on the right. 2+ on the left.  Eyes:     Conjunctiva/sclera: Conjunctivae normal.     Pupils: Pupils are equal, round, and reactive to light.  Neck:     Thyroid: No thyromegaly.     Trachea: Trachea normal.  Cardiovascular:     Rate and Rhythm: Normal rate and regular rhythm.     Heart sounds: Normal heart sounds.  Pulmonary:     Effort: Pulmonary effort is normal.     Breath sounds: Normal breath sounds.  Abdominal:     General: Bowel sounds are normal.     Palpations: Abdomen is soft.  Musculoskeletal:     Cervical back: Normal range of motion.  Lymphadenopathy:     Cervical: Cervical adenopathy present.     Right cervical: Superficial cervical adenopathy present.     Left cervical: Superficial cervical adenopathy present.  Skin:    General: Skin is warm and dry.  Neurological:     General: No focal deficit present.     Mental Status: She is alert and oriented to person, place, and time.  Psychiatric:        Mood and Affect: Mood normal.        Behavior: Behavior normal.      UC Treatments / Results  Labs (all labs ordered are listed, but only abnormal results are displayed) Labs Reviewed  SARS CORONAVIRUS 2 (TAT 6-24 HRS)    EKG   Radiology No results found.  Procedures Procedures (including critical  care time)  Medications Ordered in UC Medications - No data to display  Initial Impression / Assessment and Plan / UC Course  I have reviewed the triage vital signs and the nursing notes.  Pertinent labs & imaging results that were available during my care of the patient were reviewed by me and  considered in my medical decision making (see chart for details).  Viral Upper Respiratory Infection  1. Covid test pending 2. OTC medications as needed 3. Warm salt water gargles as needed 4. Educated about and Encouraged Vaccination  Final Clinical Impressions(s) / UC Diagnoses   Final diagnoses:  Viral upper respiratory tract infection     Discharge Instructions     Results will come back in 24 hours. Will upload to mychart if active, will be called if results positive.   Can take over the counter medication as needed for symptom relief    ED Prescriptions    None     PDMP not reviewed this encounter.   Valinda Hoar, NP 01/30/21 1554

## 2021-01-30 NOTE — Discharge Instructions (Signed)
Results will come back in 24 hours. Will upload to mychart if active, will be called if results positive.   Can take over the counter medication as needed for symptom relief

## 2021-01-31 LAB — SARS CORONAVIRUS 2 (TAT 6-24 HRS): SARS Coronavirus 2: NEGATIVE

## 2021-02-16 ENCOUNTER — Encounter (HOSPITAL_COMMUNITY): Payer: Self-pay | Admitting: Urgent Care

## 2021-02-16 ENCOUNTER — Other Ambulatory Visit: Payer: Self-pay

## 2021-02-16 ENCOUNTER — Ambulatory Visit (HOSPITAL_COMMUNITY)
Admission: EM | Admit: 2021-02-16 | Discharge: 2021-02-16 | Disposition: A | Discharge status: Home / Self Care | Payer: Medicaid Other | Attending: Urgent Care | Admitting: Urgent Care

## 2021-02-16 DIAGNOSIS — Z888 Allergy status to other drugs, medicaments and biological substances status: Secondary | ICD-10-CM | POA: Insufficient documentation

## 2021-02-16 DIAGNOSIS — R051 Acute cough: Secondary | ICD-10-CM | POA: Insufficient documentation

## 2021-02-16 DIAGNOSIS — B349 Viral infection, unspecified: Secondary | ICD-10-CM | POA: Insufficient documentation

## 2021-02-16 DIAGNOSIS — Z8619 Personal history of other infectious and parasitic diseases: Secondary | ICD-10-CM | POA: Diagnosis not present

## 2021-02-16 DIAGNOSIS — R197 Diarrhea, unspecified: Secondary | ICD-10-CM | POA: Insufficient documentation

## 2021-02-16 DIAGNOSIS — F1290 Cannabis use, unspecified, uncomplicated: Secondary | ICD-10-CM | POA: Insufficient documentation

## 2021-02-16 DIAGNOSIS — R0789 Other chest pain: Secondary | ICD-10-CM | POA: Diagnosis not present

## 2021-02-16 DIAGNOSIS — F17210 Nicotine dependence, cigarettes, uncomplicated: Secondary | ICD-10-CM | POA: Diagnosis not present

## 2021-02-16 DIAGNOSIS — R1111 Vomiting without nausea: Secondary | ICD-10-CM

## 2021-02-16 DIAGNOSIS — R1084 Generalized abdominal pain: Secondary | ICD-10-CM | POA: Diagnosis not present

## 2021-02-16 DIAGNOSIS — R059 Cough, unspecified: Secondary | ICD-10-CM

## 2021-02-16 DIAGNOSIS — Z20822 Contact with and (suspected) exposure to covid-19: Secondary | ICD-10-CM | POA: Insufficient documentation

## 2021-02-16 DIAGNOSIS — Z3202 Encounter for pregnancy test, result negative: Secondary | ICD-10-CM

## 2021-02-16 DIAGNOSIS — Z791 Long term (current) use of non-steroidal anti-inflammatories (NSAID): Secondary | ICD-10-CM | POA: Diagnosis not present

## 2021-02-16 DIAGNOSIS — Z79899 Other long term (current) drug therapy: Secondary | ICD-10-CM | POA: Diagnosis not present

## 2021-02-16 DIAGNOSIS — R112 Nausea with vomiting, unspecified: Secondary | ICD-10-CM | POA: Insufficient documentation

## 2021-02-16 DIAGNOSIS — F17200 Nicotine dependence, unspecified, uncomplicated: Secondary | ICD-10-CM

## 2021-02-16 LAB — POCT URINALYSIS DIPSTICK, ED / UC
Bilirubin Urine: NEGATIVE
Glucose, UA: NEGATIVE mg/dL
Hgb urine dipstick: NEGATIVE
Ketones, ur: 15 mg/dL — AB
Leukocytes,Ua: NEGATIVE
Nitrite: NEGATIVE
Protein, ur: NEGATIVE mg/dL
Specific Gravity, Urine: >=1.03 (ref 1.005–1.030)
Urobilinogen, UA: 0.2 mg/dL (ref 0.0–1.0)
pH: 5.5 (ref 5.0–8.0)

## 2021-02-16 LAB — SARS CORONAVIRUS 2 (TAT 6-24 HRS): SARS Coronavirus 2: NEGATIVE

## 2021-02-16 LAB — POC URINE PREG, ED: Preg Test, Ur: NEGATIVE

## 2021-02-16 MED ORDER — ONDANSETRON 8 MG PO TBDP: 8.0000 mg | ORAL_TABLET | Freq: Three times a day (TID) | ORAL | 0 refills | Status: DC | PRN

## 2021-02-16 MED ORDER — LOPERAMIDE HCL 2 MG PO CAPS: 2.0000 mg | ORAL_CAPSULE | Freq: Two times a day (BID) | ORAL | 0 refills | Status: DC | PRN

## 2021-02-16 MED ORDER — PREDNISONE 20 MG PO TABS: ORAL_TABLET | ORAL | 0 refills | Status: DC

## 2021-02-16 NOTE — Discharge Instructions (Addendum)
We will notify you of your COVID-19 test results as they arrive and may take between 24 to 48 hours.  I encourage you to sign up for MyChart if you have not already done so as this can be the easiest way for [place] to communicate results to you online or through a phone app.  In the meantime, if you develop worsening symptoms including fever, chest pain, shortness of breath despite our current treatment plan then please report to the emergency room as this may be a sign of worsening status from possible COVID-19 infection.  Otherwise, we will manage this as a viral syndrome. For sore throat or cough try using a honey-based tea. Use 3 teaspoons of honey with juice squeezed from half lemon. Place shaved pieces of ginger into 1/2-1 cup of water and warm over stove top. Then mix the ingredients and repeat every 4 hours as needed. Please take Tylenol 500mg -650mg  every 6 hours for aches and pains, fevers. Hydrate very well with at least 2 liters of water. Eat light meals such as soups to replenish electrolytes and soft fruits, veggies. Start an antihistamine like Zyrtec, Allegra or Claritin for postnasal drainage, sinus congestion.  Make sure you push fluids drinking mostly water but mix it with Gatorade.  Try to eat light meals including soups, broths and soft foods, fruits.  You may use Zofran for your nausea and vomiting once every 8 hours.  Imodium can help with diarrhea but use this carefully limiting it to 1-2 times per day only if you are having a lot of diarrhea.  Please return to the clinic if symptoms worsen or you start having severe abdominal pain not helped by taking Tylenol or start having bloody stools or blood in the vomit.

## 2021-02-16 NOTE — ED Provider Notes (Signed)
[facility]   MRN: [id number] DOB: Mar 12, 1986  Subjective:   Betty Thompson is a 35 y.o. female presenting for 4-day history of acute onset cough, nausea, vomiting, belly cramping, diarrhea, chest discomfort just above her stomach and under her breasts.  Patient was tested for COVID-19 3 weeks ago and was negative.  She wants to be checked again.  LMP was 02/12/2021, was regular.  Denies active chest pain, shortness of breath.  No fevers.  Patient is not COVID vaccinated.  Smokes cigarettes and marijuana.  Denies history of lung disorders, asthma, COPD.  Denies bloody stools, recent hospitalizations or long distance travel.  No hematemesis.  Reports that she actually did end up taking a course of penicillin for a tooth infection at the end of January going into February.  Outside of that, has not had any other antibiotics.  No current facility-administered medications for this encounter.  Current Outpatient Medications:  .  benzonatate (TESSALON) 100 MG capsule, Take 1-2 capsules (100-200 mg total) by mouth 3 (three) times daily as needed., Disp: 60 capsule, Rfl: 0 .  cetirizine (ZYRTEC ALLERGY) 10 MG tablet, Take 1 tablet (10 mg total) by mouth daily., Disp: 30 tablet, Rfl: 0 .  HYDROcodone-acetaminophen (NORCO/VICODIN) 5-325 MG tablet, Take 1 tablet by mouth every 4 (four) hours as needed., Disp: 10 tablet, Rfl: 0 .  hydrOXYzine (ATARAX/VISTARIL) 25 MG tablet, Take 1 tablet (25 mg total) by mouth every 6 (six) hours as needed for anxiety., Disp: 12 tablet, Rfl: 0 .  ibuprofen (ADVIL) 800 MG tablet, Take 1 tablet (800 mg total) by mouth 3 (three) times daily., Disp: 21 tablet, Rfl: 0 .  promethazine-dextromethorphan (PROMETHAZINE-DM) 6.25-15 MG/5ML syrup, Take 5 mLs by mouth at bedtime as needed for cough., Disp: 100 mL, Rfl: 0 .  pseudoephedrine (SUDAFED) 60 MG tablet, Take 1 tablet (60 mg total) by mouth every 8 (eight) hours as needed for congestion., Disp: 30 tablet, Rfl: 0    Allergies  Allergen Reactions  . Benadryl [Diphenhydramine] Swelling    Past Medical History:  Diagnosis Date  . Anxiety   . Depression      Past Surgical History:  Procedure Laterality Date  . CESAREAN SECTION    . MOUTH SURGERY      Family History  Family history unknown: Yes    Social History   Tobacco Use  . Smoking status: Current Every Day Smoker    Types: Cigarettes  . Smokeless tobacco: Never Used  Substance Use Topics  . Alcohol use: No  . Drug use: Yes    Frequency: 7.0 times per week    Types: Marijuana    ROS   Objective:   Vitals: BP 123/74 (BP Location: Left Arm)   Pulse 97   Temp 97.9 F (36.6 C) (Oral)   Resp 19   LMP 01/29/2021 (Exact Date)   SpO2 100%   Physical Exam Constitutional:      General: She is not in acute distress.    Appearance: Normal appearance. She is well-developed and normal weight. She is not ill-appearing, toxic-appearing or diaphoretic.  HENT:     Head: Normocephalic and atraumatic.     Right Ear: External ear normal.     Left Ear: External ear normal.     Nose: Nose normal.     Mouth/Throat:     Mouth: Mucous membranes are moist.     Pharynx: Oropharynx is clear.  Eyes:     General: No scleral icterus.  Extraocular Movements: Extraocular movements intact.     Pupils: Pupils are equal, round, and reactive to light.  Cardiovascular:     Rate and Rhythm: Normal rate and regular rhythm.     Pulses: Normal pulses.     Heart sounds: Normal heart sounds. No murmur heard. No friction rub. No gallop.   Pulmonary:     Effort: Pulmonary effort is normal. No respiratory distress.     Breath sounds: Normal breath sounds. No stridor. No wheezing, rhonchi or rales.  Abdominal:     General: Bowel sounds are normal. There is no distension.     Palpations: Abdomen is soft. There is no mass.     Tenderness: There is generalized abdominal tenderness (mild). There is no right CVA tenderness, left CVA tenderness,  guarding or rebound.  Skin:    General: Skin is warm and dry.     Coloration: Skin is not pale.     Findings: No rash.  Neurological:     General: No focal deficit present.     Mental Status: She is alert and oriented to person, place, and time.  Psychiatric:        Mood and Affect: Mood normal.        Behavior: Behavior normal.        Thought Content: Thought content normal.        Judgment: Judgment normal.     Results for orders placed or performed during the hospital encounter of 02/16/21 (from the past 24 hour(s))  POCT Urinalysis Dipstick (ED/UC)     Status: Abnormal   Collection Time: 02/16/21 12:10 PM  Result Value Ref Range   Glucose, UA NEGATIVE NEGATIVE mg/dL   Bilirubin Urine NEGATIVE NEGATIVE   Ketones, ur 15 (A) NEGATIVE mg/dL   Specific Gravity, Urine >=1.030 1.005 - 1.030   Hgb urine dipstick NEGATIVE NEGATIVE   pH 5.5 5.0 - 8.0   Protein, ur NEGATIVE NEGATIVE mg/dL   Urobilinogen, UA 0.2 0.0 - 1.0 mg/dL   Nitrite NEGATIVE NEGATIVE   Leukocytes,Ua NEGATIVE NEGATIVE  POC urine preg, ED (not at [facility]     Status: None   Collection Time: 02/16/21 12:12 PM  Result Value Ref Range   Preg Test, Ur NEGATIVE NEGATIVE    Assessment and Plan :   PDMP not reviewed this encounter.  1. Viral syndrome   2. Chest discomfort   3. Generalized abdominal pain   4. Cough   5. Diarrhea, unspecified type   6. Smoker   7. Marijuana use     Will manage for viral illness such as viral URI, viral syndrome, viral rhinitis, COVID-19, colitis, gastroenteritis, possible GI side effects from having taken a course of penicillin. Counseled patient on nature of COVID-19 including modes of transmission, diagnostic testing, management and supportive care.  Offered scripts for symptomatic relief. COVID 19 testing is pending. Counseled patient on potential for adverse effects with medications prescribed/recommended today, ER and return-to-clinic precautions discussed, patient verbalized  understanding.     Wallis Bamberg, PA-C 02/16/21 1226

## 2021-02-16 NOTE — ED Triage Notes (Signed)
Pt reports nausea, vomiting, abdominal cramping, diarrhea, chest cramping and burning x 1 day. States chest and abdominal cramps worse without food.

## 2021-03-22 IMAGING — CR DG CHEST 2V
2 series · 2 of 2 positions shown · non-contrast
Comparison: May 19, 2020

CLINICAL DATA: Chest pain

EXAM:
CHEST - 2 VIEW

[chest pa]
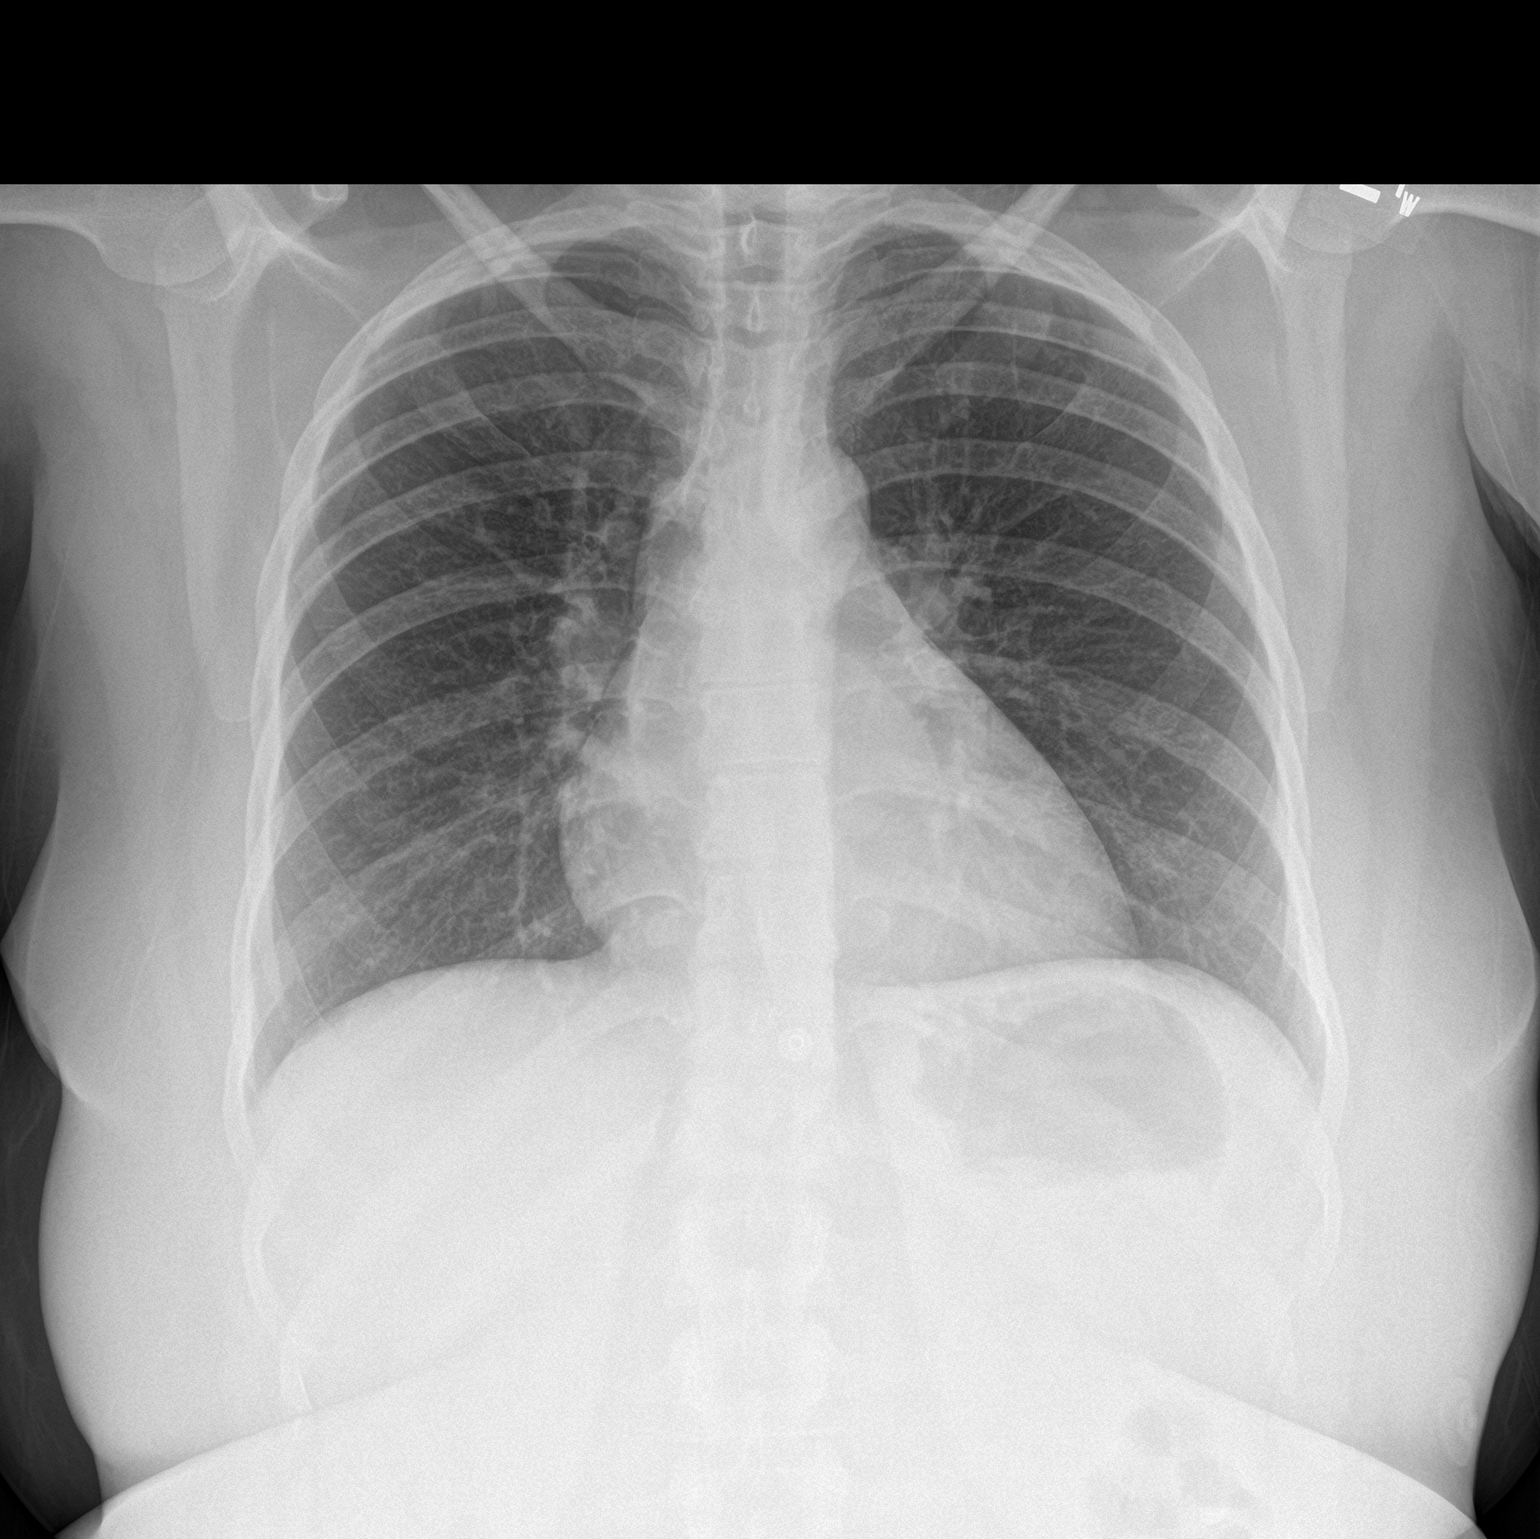

[chest lat]
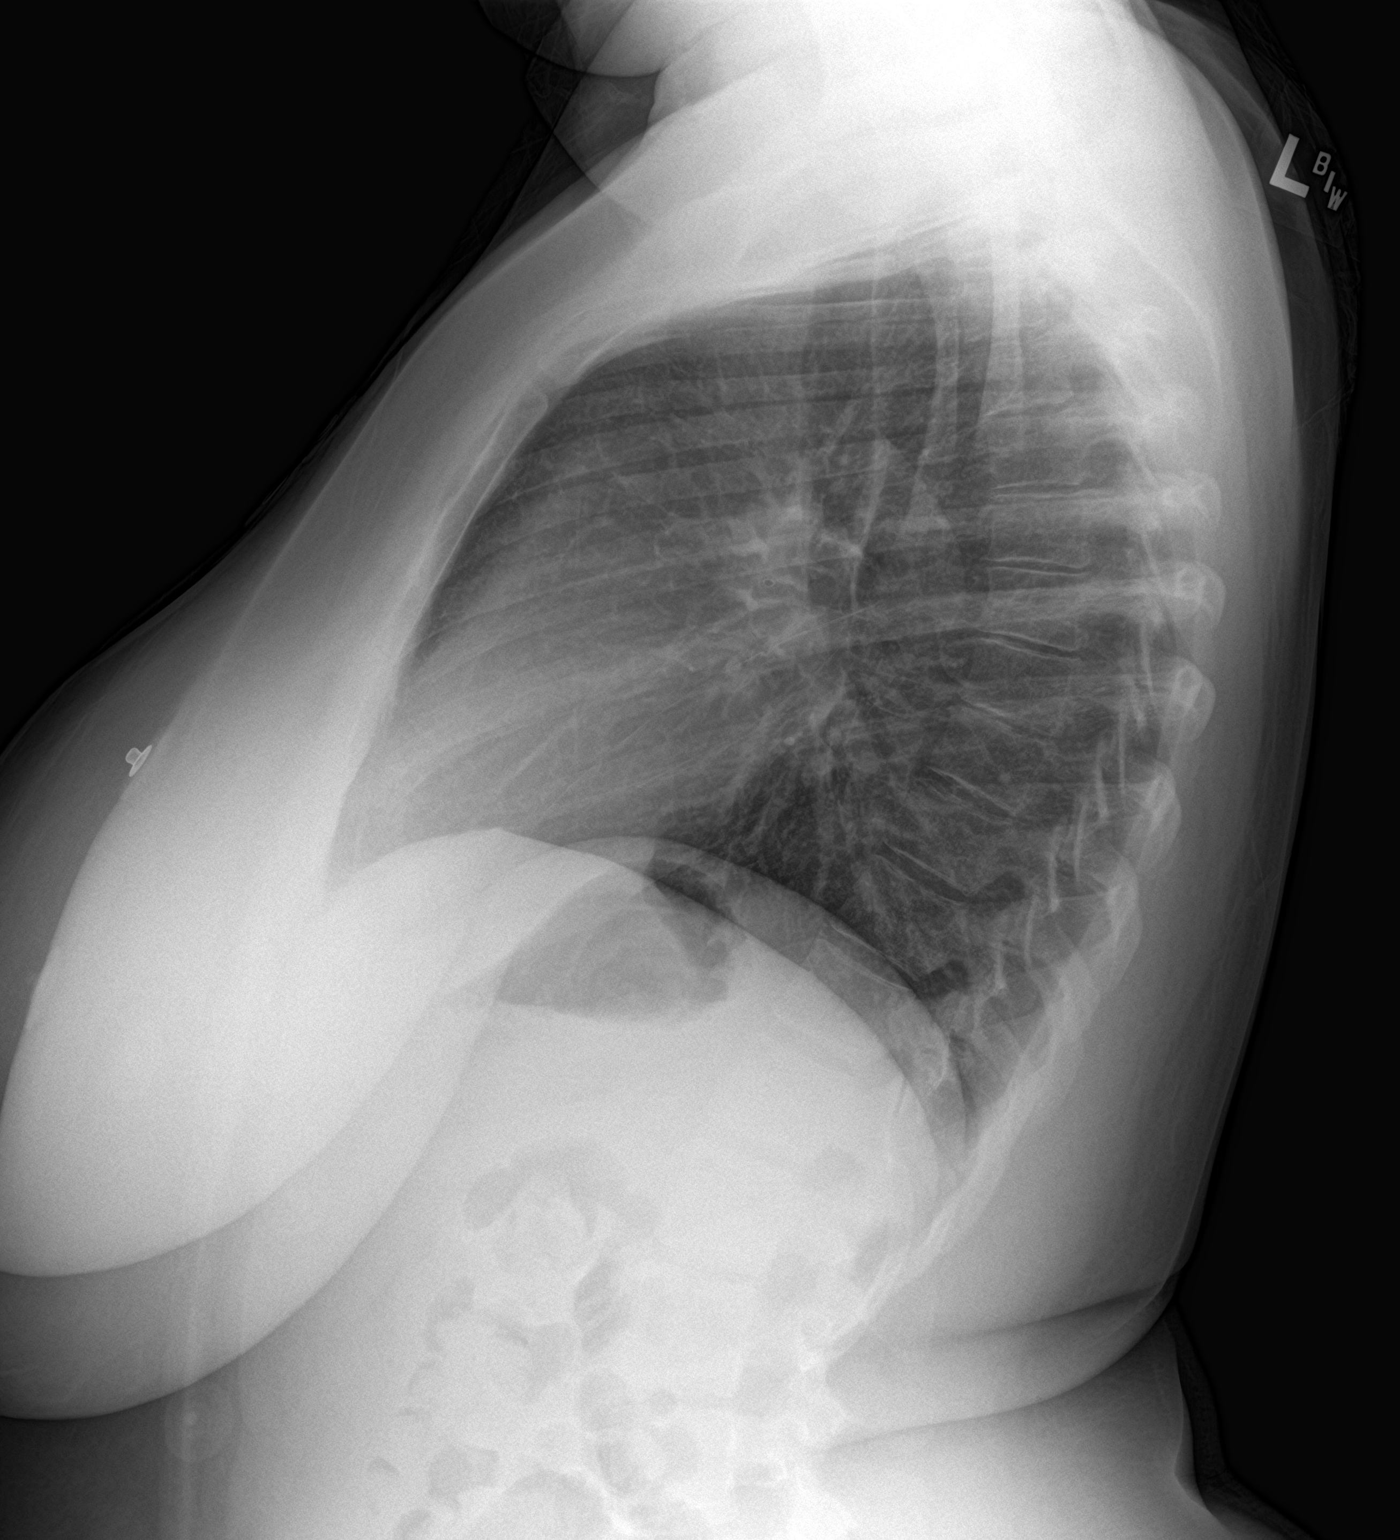

[2 of 2 positions shown; findings below may reference images not displayed]

FINDINGS: Lungs are clear. Heart size and pulmonary vascularity are normal. No
adenopathy. No pneumothorax. No bone lesions.
IMPRESSION: Lungs clear.  Cardiac silhouette normal.

## 2021-04-06 ENCOUNTER — Ambulatory Visit (HOSPITAL_COMMUNITY)
Admission: EM | Admit: 2021-04-06 | Discharge: 2021-04-06 | Disposition: A | Discharge status: Home / Self Care | Payer: Medicaid Other | Attending: Family Medicine | Admitting: Family Medicine

## 2021-04-06 ENCOUNTER — Encounter (HOSPITAL_COMMUNITY): Payer: Self-pay

## 2021-04-06 ENCOUNTER — Other Ambulatory Visit: Payer: Self-pay

## 2021-04-06 DIAGNOSIS — R0789 Other chest pain: Secondary | ICD-10-CM | POA: Insufficient documentation

## 2021-04-06 DIAGNOSIS — Z20822 Contact with and (suspected) exposure to covid-19: Secondary | ICD-10-CM | POA: Insufficient documentation

## 2021-04-06 DIAGNOSIS — J069 Acute upper respiratory infection, unspecified: Secondary | ICD-10-CM | POA: Insufficient documentation

## 2021-04-06 DIAGNOSIS — M792 Neuralgia and neuritis, unspecified: Secondary | ICD-10-CM | POA: Insufficient documentation

## 2021-04-06 LAB — SARS CORONAVIRUS 2 (TAT 6-24 HRS): SARS Coronavirus 2: NEGATIVE

## 2021-04-06 MED ORDER — PROMETHAZINE-DM 6.25-15 MG/5ML PO SYRP: 5.0000 mL | ORAL_SOLUTION | Freq: Four times a day (QID) | ORAL | 0 refills | Status: DC | PRN

## 2021-04-06 MED ORDER — PREDNISONE 20 MG PO TABS: 40.0000 mg | ORAL_TABLET | Freq: Every day | ORAL | 0 refills | Status: DC

## 2021-04-06 MED ORDER — FLUTICASONE PROPIONATE 50 MCG/ACT NA SUSP: 1.0000 | Freq: Every day | NASAL | 2 refills | Status: DC

## 2021-04-06 NOTE — ED Triage Notes (Addendum)
Pt here with c/o congestion, diarrhea, mild pain in chest and subjective left arm weakness which is not appreciable to this Clinical research associate. Left arm numb last week per pt but has since resolved. She states she went to donate plasma last week and they would not let her due to heart rate in 130s. HR variable between 89-105 at this time, VSS.

## 2021-05-03 ENCOUNTER — Other Ambulatory Visit: Payer: Self-pay

## 2021-05-03 ENCOUNTER — Encounter (HOSPITAL_COMMUNITY): Payer: Self-pay

## 2021-05-03 ENCOUNTER — Ambulatory Visit (HOSPITAL_COMMUNITY)
Admission: EM | Admit: 2021-05-03 | Discharge: 2021-05-03 | Disposition: A | Discharge status: Home / Self Care | Payer: Medicaid Other | Attending: Internal Medicine | Admitting: Internal Medicine

## 2021-05-03 DIAGNOSIS — Z20822 Contact with and (suspected) exposure to covid-19: Secondary | ICD-10-CM | POA: Insufficient documentation

## 2021-05-03 DIAGNOSIS — R079 Chest pain, unspecified: Secondary | ICD-10-CM | POA: Diagnosis present

## 2021-05-03 DIAGNOSIS — Z1152 Encounter for screening for COVID-19: Secondary | ICD-10-CM

## 2021-05-03 LAB — SARS CORONAVIRUS 2 (TAT 6-24 HRS): SARS Coronavirus 2: NEGATIVE

## 2021-05-03 NOTE — ED Notes (Signed)
Pt requests to only have Covid test completed. She declined offer to see physician. Nurse visit only.

## 2021-05-03 NOTE — ED Triage Notes (Signed)
Pt c/o chest pain and cough X 2 days. She states she has chest congestion. Pt denies left arm pain.

## 2021-08-02 ENCOUNTER — Other Ambulatory Visit: Payer: Self-pay

## 2021-08-02 ENCOUNTER — Encounter (HOSPITAL_COMMUNITY): Payer: Self-pay | Admitting: Emergency Medicine

## 2021-08-02 ENCOUNTER — Emergency Department (HOSPITAL_COMMUNITY)
Admission: EM | Admit: 2021-08-02 | Discharge: 2021-08-03 | Disposition: A | Discharge status: Home / Self Care | Payer: Medicaid Other | Attending: Emergency Medicine | Admitting: Emergency Medicine

## 2021-08-02 DIAGNOSIS — Z28310 Unvaccinated for covid-19: Secondary | ICD-10-CM | POA: Diagnosis not present

## 2021-08-02 DIAGNOSIS — U071 COVID-19: Secondary | ICD-10-CM | POA: Insufficient documentation

## 2021-08-02 DIAGNOSIS — F17210 Nicotine dependence, cigarettes, uncomplicated: Secondary | ICD-10-CM | POA: Diagnosis not present

## 2021-08-02 DIAGNOSIS — R059 Cough, unspecified: Secondary | ICD-10-CM | POA: Diagnosis present

## 2021-08-02 NOTE — ED Provider Notes (Signed)
Emergency Medicine Provider Triage Evaluation Note  Betty Thompson , a 35 y.o. female  was evaluated in triage.  Pt complains of URI.  The patient endorses cramping epigastric pain, chest pain, body aches, shortness of breath, cough, fatigue, nasal congestion, diarrhea onset today.  She denies vomiting, fever, leg swelling, back pain.  No known sick contacts.    Review of Systems  Positive: Epigastric pain, chest pain, body aches, shortness of breath, cough, fatigue, nasal congestion, diarrhea Negative: Vomiting, fever, leg swelling, back pain  Physical Exam  BP 125/78 (BP Location: Left Arm)   Pulse 96   Temp 98.6 F (37 C) (Oral)   Resp 18   Ht 5\' 2"  (1.575 m)   Wt 90 kg   LMP 07/17/2021   SpO2 100%   BMI 36.29 kg/m  Gen:   Awake, no distress   Resp:  Normal effort  MSK:   Moves extremities without difficulty  Other:  Tender palpation in the epigastric region.  Abdomen is soft and nondistended.  Phonation is congested.  Medical Decision Making  Medically screening exam initiated at 11:21 PM.  Appropriate orders placed.  Almeda Ezra was informed that the remainder of the evaluation will be completed by another provider, this initial triage assessment does not replace that evaluation, and the importance of remaining in the ED until their evaluation is complete.  Patient's care has been initiated in the ED.  She will require further work-up and evaluation.   Alfredia Client, PA-C 08/02/21 2321    10/02/21, MD 08/07/21 [phone]

## 2021-08-02 NOTE — ED Triage Notes (Signed)
Patient reports central chest pain with mild SOB this evening , occasional dry cough , she added fatigue and body aches.

## 2021-08-03 LAB — RESP PANEL BY RT-PCR (FLU A&B, COVID) ARPGX2
Influenza A by PCR: NEGATIVE
Influenza B by PCR: NEGATIVE
SARS Coronavirus 2 by RT PCR: POSITIVE — AB

## 2021-08-03 NOTE — ED Provider Notes (Signed)
[facility] EMERGENCY DEPARTMENT Provider Note   CSN: [id number] Arrival date & time: 08/02/21  2201     History Chief Complaint  Patient presents with   URI    Covid+    Betty Thompson is a 35 y.o. female.   URI Presenting symptoms: cough and fatigue   Presenting symptoms: no ear pain, no fever and no sore throat   Associated symptoms: no arthralgias    35 year old female with past medical history of anxiety and depression presenting to the emergency department with several days of cough and myalgias.  Patient reports that around 2 to 3 days ago, she started feeling fatigued.  She was recently involved in a family death and thought it was due to depression, but then she developed diffuse myalgias, cough, nasal congestion, as well as nonbloody diarrhea.  The symptoms have been constant, persistent, not necessarily worsening.  No therapies tried at home.  She denies any known sick contacts.  She is not immunized against COVID.  She denies any chest pain or shortness of breath to me.  Past Medical History:  Diagnosis Date   Anxiety    Depression     There are no problems to display for this patient.   Past Surgical History:  Procedure Laterality Date   CESAREAN SECTION     MOUTH SURGERY       OB History   No obstetric history on file.     Family History  Problem Relation Age of Onset   Heart disease Father    Heart attack Father    Stroke Father     Social History   Tobacco Use   Smoking status: Every Day    Types: Cigarettes   Smokeless tobacco: Never   Tobacco comments:    1 pack per week  Substance Use Topics   Alcohol use: No   Drug use: Yes    Frequency: 7.0 times per week    Types: Marijuana    Home Medications Prior to Admission medications   Medication Sig Start Date End Date Taking? Authorizing Provider  fluticasone (FLONASE) 50 MCG/ACT nasal spray Place 1 spray into both nostrils daily. 04/06/21   Particia Nearing, PA-C   loperamide (IMODIUM) 2 MG capsule Take 1 capsule (2 mg total) by mouth 2 (two) times daily as needed for diarrhea or loose stools. 02/16/21   Wallis Bamberg, PA-C  ondansetron (ZOFRAN-ODT) 8 MG disintegrating tablet Take 1 tablet (8 mg total) by mouth every 8 (eight) hours as needed for nausea or vomiting. 02/16/21   Wallis Bamberg, PA-C  predniSONE (DELTASONE) 20 MG tablet Take 2 tablets (40 mg total) by mouth daily with breakfast. 04/06/21   Particia Nearing, PA-C  promethazine-dextromethorphan (PROMETHAZINE-DM) 6.25-15 MG/5ML syrup Take 5 mLs by mouth 4 (four) times daily as needed for cough. 04/06/21   Particia Nearing, PA-C  cetirizine (ZYRTEC ALLERGY) 10 MG tablet Take 1 tablet (10 mg total) by mouth daily. 08/30/20 02/16/21  Wallis Bamberg, PA-C    Allergies    Benadryl [diphenhydramine]  Review of Systems   Review of Systems  Constitutional:  Positive for activity change, appetite change, chills and fatigue. Negative for fever.  HENT:  Negative for ear pain and sore throat.   Eyes:  Negative for pain and visual disturbance.  Respiratory:  Positive for cough. Negative for shortness of breath.   Cardiovascular:  Negative for chest pain and palpitations.  Gastrointestinal:  Positive for diarrhea. Negative for abdominal pain and vomiting.  Genitourinary:  Negative for dysuria and hematuria.  Musculoskeletal:  Negative for arthralgias and back pain.  Skin:  Negative for color change and rash.  Neurological:  Negative for seizures and syncope.  All other systems reviewed and are negative.  Physical Exam Updated Vital Signs BP 115/83   Pulse 89   Temp 98.6 F (37 C) (Oral)   Resp 18   Ht 5\' 2"  (1.575 m)   Wt 90 kg   LMP 07/17/2021   SpO2 100%   BMI 36.29 kg/m   Physical Exam Vitals and nursing note reviewed.  Constitutional:      General: She is not in acute distress.    Appearance: Normal appearance. She is well-developed and normal weight. She is not ill-appearing or  toxic-appearing.  HENT:     Head: Normocephalic and atraumatic.  Eyes:     Conjunctiva/sclera: Conjunctivae normal.  Cardiovascular:     Rate and Rhythm: Normal rate and regular rhythm.     Heart sounds: No murmur heard. Pulmonary:     Effort: Pulmonary effort is normal. No respiratory distress.     Breath sounds: Normal breath sounds. No stridor. No wheezing, rhonchi or rales.  Abdominal:     Palpations: Abdomen is soft.     Tenderness: There is no abdominal tenderness. There is no guarding or rebound.  Musculoskeletal:     Cervical back: Neck supple.  Skin:    General: Skin is warm and dry.     Capillary Refill: Capillary refill takes less than 2 seconds.  Neurological:     Mental Status: She is alert and oriented to person, place, and time.    ED Results / Procedures / Treatments   Labs (all labs ordered are listed, but only abnormal results are displayed) Labs Reviewed  RESP PANEL BY RT-PCR (FLU A&B, COVID) ARPGX2 - Abnormal; Notable for the following components:      Result Value   SARS Coronavirus 2 by RT PCR POSITIVE (*)    All other components within normal limits    EKG None  Radiology No results found.  Procedures Procedures   Medications Ordered in ED Medications - No data to display  ED Course  I have reviewed the triage vital signs and the nursing notes.  Pertinent labs & imaging results that were available during my care of the patient were reviewed by me and considered in my medical decision making (see chart for details).    MDM Rules/Calculators/A&P                           35 year old female presenting to the emergency department with several days of myalgias, cough, diarrhea.  Vital signs reviewed, with acceptable limits.  Physical exam is notable for a well-appearing female, clear lung sounds, no respiratory distress.  She has a benign abdomen.  I have no concern for appendicitis, cholecystitis, or other intra-abdominal pathology.  I do not  believe that imaging is indicated.  Do not believe that she has a bacterial pneumonia or pneumothorax given her symptoms and physical exam findings.  Presentation seems most consistent with a viral syndrome.  COVID-19 testing performed and returned positive, which I believe sufficiently explains the patient symptoms.  I informed the patient of her diagnosis and she then wished to be discharged from the emergency department immediately before my attending could see her.  I discussed the case with my attending and he was in agreement with the care plan, patient  does not warrant a call back to the ED.  I believe that she is stable for discharge and supportive care measures at home which I discussed with the patient prior to her leaving.  Final Clinical Impression(s) / ED Diagnoses Final diagnoses:  COVID-19    Rx / DC Orders ED Discharge Orders     None        Lenard Lance, MD 08/03/21 2409    Benjiman Core, MD 08/05/21 [phone]

## 2021-08-03 NOTE — ED Notes (Signed)
All care rendered by provider. Pt ambulated out room with a steady gait to restroom.

## 2021-10-31 ENCOUNTER — Ambulatory Visit (HOSPITAL_COMMUNITY)
Admission: EM | Admit: 2021-10-31 | Discharge: 2021-10-31 | Disposition: A | Discharge status: Home / Self Care | Payer: Medicaid Other | Attending: Emergency Medicine | Admitting: Emergency Medicine

## 2021-10-31 ENCOUNTER — Other Ambulatory Visit: Payer: Self-pay

## 2021-10-31 ENCOUNTER — Encounter (HOSPITAL_COMMUNITY): Payer: Self-pay | Admitting: Emergency Medicine

## 2021-10-31 DIAGNOSIS — Z8616 Personal history of COVID-19: Secondary | ICD-10-CM | POA: Insufficient documentation

## 2021-10-31 DIAGNOSIS — B349 Viral infection, unspecified: Secondary | ICD-10-CM | POA: Insufficient documentation

## 2021-10-31 DIAGNOSIS — F17210 Nicotine dependence, cigarettes, uncomplicated: Secondary | ICD-10-CM | POA: Diagnosis not present

## 2021-10-31 DIAGNOSIS — Z28310 Unvaccinated for covid-19: Secondary | ICD-10-CM | POA: Insufficient documentation

## 2021-10-31 DIAGNOSIS — R6883 Chills (without fever): Secondary | ICD-10-CM | POA: Insufficient documentation

## 2021-10-31 DIAGNOSIS — Z20822 Contact with and (suspected) exposure to covid-19: Secondary | ICD-10-CM | POA: Insufficient documentation

## 2021-10-31 DIAGNOSIS — R059 Cough, unspecified: Secondary | ICD-10-CM | POA: Insufficient documentation

## 2021-10-31 LAB — RESPIRATORY PANEL BY PCR

## 2021-10-31 LAB — POC INFLUENZA A AND B ANTIGEN (URGENT CARE ONLY)
INFLUENZA A ANTIGEN, POC: NEGATIVE
INFLUENZA B ANTIGEN, POC: NEGATIVE

## 2021-10-31 NOTE — ED Triage Notes (Signed)
Pt c/o cough, chills, body aches since today.

## 2021-10-31 NOTE — ED Provider Notes (Signed)
[facility]    CSN: [id number] Arrival date & time: 10/31/21  1812      History   Chief Complaint Chief Complaint  Patient presents with   Chills   Cough   Generalized Body Aches    HPI Betty Thompson is a 35 y.o. female.   Patient here for evaluation of cough, chills, and body aches that started today.  Unsure of any fevers at home but temperature 100.4 in office.  Has not taken any OTC medications or treatments.  Denies any recent sick contacts but patient does work at a college.  Patient does report history of COVID and states this feels similar.  Has not been vaccinated for the flu or COVID.  Denies any trauma, injury, or other precipitating event.  Denies any specific alleviating or aggravating factors.  Denies any chest pain, shortness of breath, N/V/D, numbness, tingling, weakness, abdominal pain, or headaches.    The history is provided by the patient.  Cough Associated symptoms: myalgias    Past Medical History:  Diagnosis Date   Anxiety    Depression     There are no problems to display for this patient.   Past Surgical History:  Procedure Laterality Date   CESAREAN SECTION     MOUTH SURGERY      OB History   No obstetric history on file.      Home Medications    Prior to Admission medications   Medication Sig Start Date End Date Taking? Authorizing Provider  fluticasone (FLONASE) 50 MCG/ACT nasal spray Place 1 spray into both nostrils daily. 04/06/21   Volney American, PA-C  loperamide (IMODIUM) 2 MG capsule Take 1 capsule (2 mg total) by mouth 2 (two) times daily as needed for diarrhea or loose stools. 02/16/21   Jaynee Eagles, PA-C  ondansetron (ZOFRAN-ODT) 8 MG disintegrating tablet Take 1 tablet (8 mg total) by mouth every 8 (eight) hours as needed for nausea or vomiting. 02/16/21   Jaynee Eagles, PA-C  predniSONE (DELTASONE) 20 MG tablet Take 2 tablets (40 mg total) by mouth daily with breakfast. 04/06/21   Volney American, PA-C   promethazine-dextromethorphan (PROMETHAZINE-DM) 6.25-15 MG/5ML syrup Take 5 mLs by mouth 4 (four) times daily as needed for cough. 04/06/21   Volney American, PA-C  cetirizine (ZYRTEC ALLERGY) 10 MG tablet Take 1 tablet (10 mg total) by mouth daily. 08/30/20 02/16/21  Jaynee Eagles, PA-C    Family History Family History  Problem Relation Age of Onset   Heart disease Father    Heart attack Father    Stroke Father     Social History Social History   Tobacco Use   Smoking status: Every Day    Types: Cigarettes   Smokeless tobacco: Never   Tobacco comments:    1 pack per week  Substance Use Topics   Alcohol use: No   Drug use: Yes    Frequency: 7.0 times per week    Types: Marijuana     Allergies   Benadryl [diphenhydramine]   Review of Systems Review of Systems  HENT:  Positive for congestion.   Respiratory:  Positive for cough.   Musculoskeletal:  Positive for myalgias.  All other systems reviewed and are negative.   Physical Exam Triage Vital Signs ED Triage Vitals  Enc Vitals Group     BP 10/31/21 1918 116/90     Pulse Rate 10/31/21 1918 (!) 119     Resp 10/31/21 1918 20     Temp  10/31/21 1918 (!) 100.4 F (38 C)     Temp Source 10/31/21 1918 Oral     SpO2 10/31/21 1918 98 %     Weight --      Height --      Head Circumference --      Peak Flow --      Pain Score 10/31/21 1917 10     Pain Loc --      Pain Edu? --      Excl. in [place]? --    No data found.  Updated Vital Signs BP 116/90 (BP Location: Left Arm)   Pulse (!) 119   Temp (!) 100.4 F (38 C) (Oral)   Resp 20   LMP 10/26/2021   SpO2 98%   Visual Acuity Right Eye Distance:   Left Eye Distance:   Bilateral Distance:    Right Eye Near:   Left Eye Near:    Bilateral Near:     Physical Exam   UC Treatments / Results  Labs (all labs ordered are listed, but only abnormal results are displayed) Labs Reviewed  RESPIRATORY PANEL BY PCR  SARS CORONAVIRUS 2 (TAT 6-24 HRS)  POC  INFLUENZA A AND B ANTIGEN (URGENT CARE ONLY)    EKG   Radiology No results found.  Procedures Procedures (including critical care time)  Medications Ordered in UC Medications - No data to display  Initial Impression / Assessment and Plan / UC Course  I have reviewed the triage vital signs and the nursing notes.  Pertinent labs & imaging results that were available during my care of the patient were reviewed by me and considered in my medical decision making (see chart for details).    Assessment negative for red flags or concerns.  Rapid flu negative.  Patient did report concern about RSV and COVID so respiratory panel and COVID test pending.  Tylenol and or ibuprofen as needed.  Encourage fluids and rest.  Discussed conservative symptom management as described in discharge instruction.  Patient given note for work.  Follow-up with primary care for reevaluation of soon as possible. Final Clinical Impressions(s) / UC Diagnoses   Final diagnoses:  Viral illness     Discharge Instructions      We will contact you if your COVID, flu, or RSV test is positive.  Please quarantine while you wait for the results.  If your test is negative you may resume normal activities.  If your test is positive please continue to quarantine for at least 5 days from your symptom onset or until you are without a fever for at least 24 hours after the medications.  You can take Tylenol and/or Ibuprofen as needed for fever reduction and pain relief.   For cough: honey 1/2 to 1 teaspoon (you can dilute the honey in water or another fluid).  You can also use guaifenesin and dextromethorphan for cough. You can use a humidifier for chest congestion and cough.  If you don't have a humidifier, you can sit in the bathroom with the hot shower running.     For sore throat: try warm salt water gargles, cepacol lozenges, throat spray, warm tea or water with lemon/honey, popsicles or ice, or OTC cold relief medicine  for throat discomfort.    For congestion: take a daily anti-histamine like Zyrtec, Claritin, and a oral decongestant, such as pseudoephedrine.  You can also use Flonase 1-2 sprays in each nostril daily.    It is important to stay hydrated: drink plenty  of fluids (water, gatorade/powerade/pedialyte, juices, or teas) to keep your throat moisturized and help further relieve irritation/discomfort.   Return or go to the Emergency Department if symptoms worsen or do not improve in the next few days.      ED Prescriptions   None    PDMP not reviewed this encounter.   Ivette Loyal, NP 10/31/21 1958

## 2021-10-31 NOTE — Discharge Instructions (Signed)
We will contact you if your COVID, flu, or RSV test is positive.  Please quarantine while you wait for the results.  If your test is negative you may resume normal activities.  If your test is positive please continue to quarantine for at least 5 days from your symptom onset or until you are without a fever for at least 24 hours after the medications.  You can take Tylenol and/or Ibuprofen as needed for fever reduction and pain relief.   For cough: honey 1/2 to 1 teaspoon (you can dilute the honey in water or another fluid).  You can also use guaifenesin and dextromethorphan for cough. You can use a humidifier for chest congestion and cough.  If you don't have a humidifier, you can sit in the bathroom with the hot shower running.     For sore throat: try warm salt water gargles, cepacol lozenges, throat spray, warm tea or water with lemon/honey, popsicles or ice, or OTC cold relief medicine for throat discomfort.    For congestion: take a daily anti-histamine like Zyrtec, Claritin, and a oral decongestant, such as pseudoephedrine.  You can also use Flonase 1-2 sprays in each nostril daily.    It is important to stay hydrated: drink plenty of fluids (water, gatorade/powerade/pedialyte, juices, or teas) to keep your throat moisturized and help further relieve irritation/discomfort.   Return or go to the Emergency Department if symptoms worsen or do not improve in the next few days.

## 2021-11-01 LAB — SARS CORONAVIRUS 2 (TAT 6-24 HRS): SARS Coronavirus 2: NEGATIVE

## 2021-12-13 ENCOUNTER — Other Ambulatory Visit: Payer: Self-pay

## 2021-12-13 ENCOUNTER — Encounter (HOSPITAL_COMMUNITY): Payer: Self-pay | Admitting: Emergency Medicine

## 2021-12-13 ENCOUNTER — Ambulatory Visit (HOSPITAL_COMMUNITY)
Admission: EM | Admit: 2021-12-13 | Discharge: 2021-12-13 | Disposition: A | Discharge status: Home / Self Care | Payer: Medicaid Other | Attending: Internal Medicine | Admitting: Internal Medicine

## 2021-12-13 DIAGNOSIS — F419 Anxiety disorder, unspecified: Secondary | ICD-10-CM

## 2021-12-13 DIAGNOSIS — F12922 Cannabis use, unspecified with intoxication with perceptual disturbance: Secondary | ICD-10-CM | POA: Diagnosis not present

## 2021-12-13 MED ORDER — DIAZEPAM 5 MG/ML IJ SOLN
5.0000 mg | Freq: Once | INTRAMUSCULAR | Status: AC
  Administered 2021-12-13: 5 mg via INTRAMUSCULAR

## 2021-12-13 MED ORDER — DIAZEPAM 5 MG/ML IJ SOLN
INTRAMUSCULAR | Status: AC
  Filled 2021-12-13: qty 2

## 2021-12-13 MED ORDER — ONDANSETRON 4 MG PO TBDP
4.0000 mg | ORAL_TABLET | Freq: Once | ORAL | Status: AC
  Administered 2021-12-13: 4 mg via ORAL

## 2021-12-13 MED ORDER — ONDANSETRON 4 MG PO TBDP
ORAL_TABLET | ORAL | Status: AC
  Filled 2021-12-13: qty 1

## 2021-12-13 NOTE — ED Triage Notes (Signed)
Patient reportedly smoking marijuana today with family member.  Patient arrives hyperventilating.  Reports chest pain with deep inspiration.  Pain is sharp.  Feels like heart is racing.  Skin warm, dry.  Rapid respirations, right radial pulse 2 +.  Patient is alert and oriented x 3 , answering questions appropriately.  Patient is unable to slow respirations

## 2021-12-13 NOTE — ED Notes (Signed)
Patient is being discharged from the Urgent Care and sent to the Emergency Department via pov/significant other . Per dr Leonides Grills, patient is in need of higher level of care due to no improvement in overall symptoms with available resources at ucc, continued hyperventilating and chest pain. Patient / and significant other is aware and verbalizes understanding of plan of care.  Vitals:   12/13/21 1122 12/13/21 1248  BP: (!) 107/55 (!) 91/56  Pulse: 97 92  Resp: (!) 40 (!) 36  SpO2: 100% 96%

## 2021-12-13 NOTE — Discharge Instructions (Signed)
Please go to the emergency department for further evaluation.

## 2021-12-13 NOTE — ED Provider Notes (Signed)
[facility]    CSN: [id number] Arrival date & time: 12/13/21  1047      History   Chief Complaint Chief Complaint  Patient presents with   Chest Pain   marijuanna    HPI Betty Thompson is a 35 y.o. female is accompanied by her partner to the urgent care on account of severe anxiety, palpitations, chest pain and fluttering of her heart.  Patient's symptoms started about an hour after she smoked marijuana.  Patient supplier is different this time around.  Patient complains of slurry speech and blurry vision.  She denies having such symptoms in the past.  She is concerned that this product has been laced with other illicit substances.  Patient has nausea but no vomiting.  No loss of bladder or bowel continence. HPI  Past Medical History:  Diagnosis Date   Anxiety    Depression     There are no problems to display for this patient.   Past Surgical History:  Procedure Laterality Date   CESAREAN SECTION     MOUTH SURGERY      OB History   No obstetric history on file.      Home Medications    Prior to Admission medications   Medication Sig Start Date End Date Taking? Authorizing Provider  cetirizine (ZYRTEC ALLERGY) 10 MG tablet Take 1 tablet (10 mg total) by mouth daily. 08/30/20 02/16/21  Wallis Bamberg, PA-C    Family History Family History  Problem Relation Age of Onset   Heart disease Father    Heart attack Father    Stroke Father     Social History Social History   Tobacco Use   Smoking status: Every Day    Types: Cigarettes   Smokeless tobacco: Never   Tobacco comments:    1 pack per week  Vaping Use   Vaping Use: Never used  Substance Use Topics   Alcohol use: No   Drug use: Yes    Frequency: 7.0 times per week    Types: Marijuana     Allergies   Benadryl [diphenhydramine]   Review of Systems Review of Systems  Constitutional: Negative.   HENT: Negative.    Gastrointestinal:  Positive for nausea. Negative for diarrhea and  vomiting.  Neurological:  Positive for dizziness.  Psychiatric/Behavioral:  Positive for agitation, confusion and dysphoric mood. Negative for hallucinations, sleep disturbance and suicidal ideas. The patient is nervous/anxious and is hyperactive.     Physical Exam Triage Vital Signs ED Triage Vitals  Enc Vitals Group     BP 12/13/21 1122 (!) 107/55     Pulse Rate 12/13/21 1122 97     Resp 12/13/21 1122 (!) 40     Temp --      Temp src --      SpO2 12/13/21 1122 100 %     Weight --      Height --      Head Circumference --      Peak Flow --      Pain Score 12/13/21 1155 10     Pain Loc --      Pain Edu? --      Excl. in GC? --    No data found.  Updated Vital Signs BP (!) 91/56 (BP Location: Left Arm)    Pulse 92    Resp (!) 36    SpO2 96%   Visual Acuity Right Eye Distance:   Left Eye Distance:   Bilateral Distance:  Right Eye Near:   Left Eye Near:    Bilateral Near:     Physical Exam Vitals and nursing note reviewed.  Constitutional:      General: She is in acute distress.     Appearance: She is not ill-appearing.     Comments: Anxious looking and restless  Cardiovascular:     Rate and Rhythm: Normal rate and regular rhythm.     Heart sounds: Normal heart sounds.  Pulmonary:     Effort: Pulmonary effort is normal. Tachypnea present.     Breath sounds: No decreased breath sounds, wheezing or rhonchi.  Abdominal:     General: Bowel sounds are normal.     Palpations: Abdomen is soft.  Musculoskeletal:     Cervical back: Normal range of motion.  Neurological:     General: No focal deficit present.     Mental Status: She is alert. She is disoriented.     UC Treatments / Results  Labs (all labs ordered are listed, but only abnormal results are displayed) Labs Reviewed - No data to display  EKG   Radiology No results found.  Procedures Procedures (including critical care time)  Medications Ordered in UC Medications  diazepam (VALIUM)  injection 5 mg (5 mg Intramuscular Given 12/13/21 1200)  ondansetron (ZOFRAN-ODT) disintegrating tablet 4 mg (4 mg Oral Given 12/13/21 1210)    Initial Impression / Assessment and Plan / UC Course  I have reviewed the triage vital signs and the nursing notes.  Pertinent labs & imaging results that were available during my care of the patient were reviewed by me and considered in my medical decision making (see chart for details).     1.  Marijuana intoxication with perceptual disturbance: 5 mg diazepam IM given Zofran 4 mg ODT for nausea We will monitor the patient for about 45 minutes to 1 hour with no improvement in her symptoms EKG showed normal sinus rhythm Given the persistent nature of the patient's symptoms, patient was advised to go to the emergency department for further evaluation and prolonged monitoring.  Patient agrees with plan of care.  They were transported to the emergency department via wheelchair.  Blood pressure is normal with a normal heart rate and tachypnea. Final Clinical Impressions(s) / UC Diagnoses   Final diagnoses:  Severe anxiety  Marijuana intoxication, with perceptual disturbance [facility])     Discharge Instructions      Please go to the emergency department for further evaluation.   ED Prescriptions   None    PDMP not reviewed this encounter.   Chase Picket, MD 12/13/21 [phone]

## 2022-03-15 ENCOUNTER — Ambulatory Visit: Payer: Medicaid Other | Admitting: Nurse Practitioner

## 2022-05-13 NOTE — Progress Notes (Deleted)
   New Patient Office Visit  Subjective    Patient ID: Betty Thompson, female    DOB: May 17, 1986  Age: 36 y.o. MRN: [id number]  CC: No chief complaint on file.   HPI Betty Thompson presents to establish care HIV HCV Pap  Outpatient Encounter Medications as of 05/14/2022  Medication Sig  . [DISCONTINUED] cetirizine (ZYRTEC ALLERGY) 10 MG tablet Take 1 tablet (10 mg total) by mouth daily.   No facility-administered encounter medications on file as of 05/14/2022.    Past Medical History:  Diagnosis Date  . Anxiety   . Depression     Past Surgical History:  Procedure Laterality Date  . CESAREAN SECTION    . MOUTH SURGERY      Family History  Problem Relation Age of Onset  . Heart disease Father   . Heart attack Father   . Stroke Father     Social History   Socioeconomic History  . Marital status: Single    Spouse name: Not on file  . Number of children: Not on file  . Years of education: Not on file  . Highest education level: Not on file  Occupational History  . Not on file  Tobacco Use  . Smoking status: Every Day    Types: Cigarettes  . Smokeless tobacco: Never  . Tobacco comments:    1 pack per week  Vaping Use  . Vaping Use: Never used  Substance and Sexual Activity  . Alcohol use: No  . Drug use: Yes    Frequency: 7.0 times per week    Types: Marijuana  . Sexual activity: Yes    Birth control/protection: None  Other Topics Concern  . Not on file  Social History Narrative  . Not on file   Social Determinants of Health   Financial Resource Strain: Not on file  Food Insecurity: Not on file  Transportation Needs: Not on file  Physical Activity: Not on file  Stress: Not on file  Social Connections: Not on file  Intimate Partner Violence: Not on file    ROS      Objective    There were no vitals taken for this visit.  Physical Exam  {Labs (Optional):23779}    Assessment & Plan:   Problem List Items Addressed This Visit    None   No follow-ups on file.   Shan Levans, MD

## 2022-05-14 ENCOUNTER — Ambulatory Visit: Payer: Medicaid Other | Admitting: Critical Care Medicine

## 2022-05-28 ENCOUNTER — Ambulatory Visit (INDEPENDENT_AMBULATORY_CARE_PROVIDER_SITE_OTHER): Payer: Medicaid Other | Admitting: Obstetrics & Gynecology

## 2022-05-28 ENCOUNTER — Encounter: Payer: Self-pay | Admitting: Obstetrics & Gynecology

## 2022-05-28 ENCOUNTER — Other Ambulatory Visit (HOSPITAL_COMMUNITY)
Admission: RE | Admit: 2022-05-28 | Discharge: 2022-05-28 | Disposition: A | Discharge status: Home / Self Care | Payer: Medicaid Other | Source: Ambulatory Visit | Attending: Obstetrics & Gynecology | Admitting: Obstetrics & Gynecology

## 2022-05-28 VITALS — BP 127/84 | HR 109 | Ht 61.0 in | Wt 190.5 lb

## 2022-05-28 DIAGNOSIS — Z01419 Encounter for gynecological examination (general) (routine) without abnormal findings: Secondary | ICD-10-CM | POA: Insufficient documentation

## 2022-05-28 DIAGNOSIS — Z8619 Personal history of other infectious and parasitic diseases: Secondary | ICD-10-CM | POA: Diagnosis not present

## 2022-05-28 DIAGNOSIS — A5901 Trichomonal vulvovaginitis: Secondary | ICD-10-CM | POA: Diagnosis not present

## 2022-05-28 DIAGNOSIS — N920 Excessive and frequent menstruation with regular cycle: Secondary | ICD-10-CM | POA: Diagnosis not present

## 2022-05-28 MED ORDER — VALACYCLOVIR HCL 500 MG PO TABS: ORAL_TABLET | ORAL | 0 refills | Status: DC

## 2022-05-28 NOTE — Progress Notes (Signed)
NGYN pt in office to establish care. Pt c/o of irregular periods. She states her last period only lasted 2 day and she has been experiencing abnormal premenstrual symptoms. This has been occurring for almost 1 year. Last PAP unknown. Pt interested in fertility testing to conceive.

## 2022-05-28 NOTE — Progress Notes (Signed)
Patient ID: Betty Thompson, female   DOB: [birth year], 36 y.o.   MRN: [id number]  Cc: menses regular but heavier and prolonged   HPI Betty Thompson is a 36 y.o. female.  [id number] Patient's last menstrual period was 05/04/2022. She has been in same-sex relationships since last child was born 9 years ago but she has menstrual problem and she wonders if she would have infertility.  HPI  Past Medical History:  Diagnosis Date   Anxiety    Depression     Past Surgical History:  Procedure Laterality Date   CESAREAN SECTION     MOUTH SURGERY      Family History  Problem Relation Age of Onset   Heart disease Father    Heart attack Father    Stroke Father     Social History Social History   Tobacco Use   Smoking status: Every Day    Packs/day: 0.50    Types: Cigarettes   Smokeless tobacco: Never   Tobacco comments:    1 pack per week  Vaping Use   Vaping Use: Never used  Substance Use Topics   Alcohol use: No   Drug use: Yes    Frequency: 7.0 times per week    Types: Marijuana    Allergies  Allergen Reactions   Benadryl [Diphenhydramine] Swelling    No current outpatient medications on file.   No current facility-administered medications for this visit.    Review of Systems Review of Systems  Constitutional: Negative.   Respiratory: Negative.    Cardiovascular: Negative.   Gastrointestinal: Negative.   Genitourinary:  Positive for menstrual problem and pelvic pain (cramps).  Musculoskeletal: Negative.   Neurological:  Positive for headaches.   Blood pressure 127/84, pulse (!) 109, height 5\' 1"  (1.549 m), weight 190 lb 8 oz (86.4 kg), last menstrual period 05/04/2022.  Physical Exam Physical Exam Vitals and nursing note reviewed. Exam conducted with a chaperone present.  Constitutional:      Appearance: She is obese. She is not ill-appearing.  HENT:     Head: Normocephalic and atraumatic.  Cardiovascular:     Rate and Rhythm: Normal rate.  Pulmonary:      Effort: Pulmonary effort is normal.  Chest:  Breasts:    Right: Normal.     Left: Normal.  Abdominal:     General: Abdomen is flat.     Palpations: Abdomen is soft.  Genitourinary:    General: Normal vulva.     Exam position: Lithotomy position.     Vagina: Normal.     Cervix: Normal.     Uterus: Normal.      Adnexa: Right adnexa normal and left adnexa normal.  Lymphadenopathy:     Upper Body:     Right upper body: No axillary adenopathy.     Left upper body: No axillary adenopathy.  Skin:    General: Skin is warm and dry.  Neurological:     Mental Status: She is alert.  Psychiatric:        Mood and Affect: Mood normal.        Behavior: Behavior normal.    Data Reviewed none  Assessment Well woman exam with routine gynecological exam - Plan: Cytology - PAP( [place]), Cervicovaginal ancillary only( [place]), HIV antibody (with reflex), Hepatitis C Antibody, Hepatitis B Surface AntiGEN, RPR, Ambulatory referral to Integrated Behavioral Health  Menorrhagia with regular cycle - Plan: 07/04/2022 PELVIC COMPLETE WITH TRANSVAGINAL, CBC She requested STD tests. Will screen for anemia  Plan Orders Placed This Encounter  Procedures   US PELVIC COMPLETE WITH TRANSVAGINAL    Standing Status:   Future    Standing Expiration Date:   08/28/2022    Order Specific Question:   Reason for Exam (SYMPTOM  OR DIAGNOSIS REQUIRED)    Answer:   menorrhagia and pain    Order Specific Question:   Preferred imaging location?    Answer:   [facility]    Order Specific Question:   Release to patient    Answer:   Immediate   HIV antibody (with reflex)   Hepatitis C Antibody   Hepatitis B Surface AntiGEN   RPR   CBC   Ambulatory referral to Integrated Behavioral Health    Referral Priority:   Routine    Referral Type:   Consultation    Referral Reason:   Specialty Services Required    Number of Visits Requested:   1   F/u on test results and will consider cycle control     Scheryl Darter 05/28/2022, 2:40 PM

## 2022-05-29 LAB — RPR: RPR Ser Ql: NONREACTIVE

## 2022-05-29 LAB — CERVICOVAGINAL ANCILLARY ONLY
Bacterial Vaginitis (gardnerella): NEGATIVE
Candida Glabrata: NEGATIVE
Candida Vaginitis: NEGATIVE
Chlamydia: NEGATIVE
Comment: NEGATIVE
Comment: NEGATIVE
Comment: NEGATIVE
Comment: NEGATIVE
Comment: NEGATIVE
Comment: NORMAL
Neisseria Gonorrhea: NEGATIVE
Trichomonas: POSITIVE — AB

## 2022-05-29 LAB — CBC
Hematocrit: 37.9 % (ref 34.0–46.6)
Hemoglobin: 12.8 g/dL (ref 11.1–15.9)
MCH: 28.3 pg (ref 26.6–33.0)
MCHC: 33.8 g/dL (ref 31.5–35.7)
MCV: 84 fL (ref 79–97)
Platelets: 281 10*3/uL (ref 150–450)
RBC: 4.52 x10E6/uL (ref 3.77–5.28)
RDW: 13.1 % (ref 11.7–15.4)
WBC: 6.5 10*3/uL (ref 3.4–10.8)

## 2022-05-29 LAB — HEPATITIS B SURFACE ANTIGEN: Hepatitis B Surface Ag: NEGATIVE

## 2022-05-29 LAB — HEPATITIS C ANTIBODY: Hep C Virus Ab: NONREACTIVE

## 2022-05-29 LAB — HIV ANTIBODY (ROUTINE TESTING W REFLEX): HIV Screen 4th Generation wRfx: NONREACTIVE

## 2022-05-30 ENCOUNTER — Encounter: Payer: Self-pay | Admitting: *Deleted

## 2022-05-30 ENCOUNTER — Other Ambulatory Visit: Payer: Self-pay | Admitting: *Deleted

## 2022-05-30 MED ORDER — METRONIDAZOLE 500 MG PO TABS: 500.0000 mg | ORAL_TABLET | Freq: Two times a day (BID) | ORAL | 0 refills | Status: DC

## 2022-05-30 NOTE — Progress Notes (Unsigned)
Flagyl sent today for +Trich. Mychart msg will be sent to pt, unable to reach by phone.

## 2022-06-05 ENCOUNTER — Encounter: Payer: Self-pay | Admitting: Obstetrics & Gynecology

## 2022-06-05 LAB — CYTOLOGY - PAP
Comment: NEGATIVE
Diagnosis: NEGATIVE
High risk HPV: NEGATIVE

## 2022-06-18 ENCOUNTER — Ambulatory Visit
Admission: RE | Admit: 2022-06-18 | Discharge: 2022-06-18 | Disposition: A | Discharge status: Home / Self Care | Payer: Medicaid Other | Source: Ambulatory Visit | Attending: Obstetrics & Gynecology | Admitting: Obstetrics & Gynecology

## 2022-06-18 DIAGNOSIS — N920 Excessive and frequent menstruation with regular cycle: Secondary | ICD-10-CM | POA: Insufficient documentation

## 2022-06-28 ENCOUNTER — Emergency Department (HOSPITAL_COMMUNITY): Payer: Medicaid Other

## 2022-06-28 ENCOUNTER — Encounter (HOSPITAL_COMMUNITY): Payer: Self-pay

## 2022-06-28 ENCOUNTER — Emergency Department (HOSPITAL_COMMUNITY)
Admission: EM | Admit: 2022-06-28 | Discharge: 2022-06-28 | Disposition: A | Discharge status: Home / Self Care | Payer: Medicaid Other | Attending: Emergency Medicine | Admitting: Emergency Medicine

## 2022-06-28 ENCOUNTER — Other Ambulatory Visit: Payer: Self-pay

## 2022-06-28 DIAGNOSIS — Z79899 Other long term (current) drug therapy: Secondary | ICD-10-CM | POA: Diagnosis not present

## 2022-06-28 DIAGNOSIS — R079 Chest pain, unspecified: Secondary | ICD-10-CM | POA: Diagnosis present

## 2022-06-28 DIAGNOSIS — F1290 Cannabis use, unspecified, uncomplicated: Secondary | ICD-10-CM

## 2022-06-28 DIAGNOSIS — F1490 Cocaine use, unspecified, uncomplicated: Secondary | ICD-10-CM

## 2022-06-28 DIAGNOSIS — E0590 Thyrotoxicosis, unspecified without thyrotoxic crisis or storm: Secondary | ICD-10-CM

## 2022-06-28 LAB — BASIC METABOLIC PANEL
Anion gap: 11 (ref 5–15)
BUN: 6 mg/dL (ref 6–20)
CO2: 18 mmol/L — ABNORMAL LOW (ref 22–32)
Calcium: 9.4 mg/dL (ref 8.9–10.3)
Chloride: 111 mmol/L (ref 98–111)
Creatinine, Ser: 0.67 mg/dL (ref 0.44–1.00)
GFR, Estimated: >60 mL/min (ref >60)
Glucose, Bld: 95 mg/dL (ref 70–99)
Potassium: 3.5 mmol/L (ref 3.5–5.1)
Sodium: 140 mmol/L (ref 135–145)

## 2022-06-28 LAB — CBC
HCT: 37.8 % (ref 36.0–46.0)
Hemoglobin: 12.8 g/dL (ref 12.0–15.0)
MCH: 27.4 pg (ref 26.0–34.0)
MCHC: 33.9 g/dL (ref 30.0–36.0)
MCV: 80.9 fL (ref 80.0–100.0)
Platelets: 292 10*3/uL (ref 150–400)
RBC: 4.67 MIL/uL (ref 3.87–5.11)
RDW: 12.6 % (ref 11.5–15.5)
WBC: 7.2 10*3/uL (ref 4.0–10.5)
nRBC: 0 % (ref 0.0–0.2)

## 2022-06-28 LAB — RAPID URINE DRUG SCREEN, HOSP PERFORMED
Amphetamines: NOT DETECTED
Barbiturates: NOT DETECTED
Benzodiazepines: NOT DETECTED
Cocaine: POSITIVE — AB
Opiates: NOT DETECTED
Tetrahydrocannabinol: POSITIVE — AB

## 2022-06-28 LAB — T4, FREE: Free T4: 2.74 ng/dL — ABNORMAL HIGH (ref 0.61–1.12)

## 2022-06-28 LAB — TROPONIN I (HIGH SENSITIVITY)
Troponin I (High Sensitivity): 3 ng/L (ref <18)
Troponin I (High Sensitivity): 3 ng/L (ref <18)

## 2022-06-28 LAB — TSH: TSH: <0.01 u[IU]/mL — ABNORMAL LOW (ref 0.350–4.500)

## 2022-06-28 MED ORDER — IBUPROFEN 800 MG PO TABS
800.0000 mg | ORAL_TABLET | Freq: Once | ORAL | Status: AC
  Administered 2022-06-28: 800 mg via ORAL
  Filled 2022-06-28: qty 1

## 2022-06-28 NOTE — ED Provider Notes (Signed)
[facility] EMERGENCY DEPARTMENT Provider Note   CSN: [id number] Arrival date & time: 06/28/22  0210     History  Chief Complaint  Patient presents with   Chest Pain    Alenah Sarria is a 36 y.o. female.  Patient is a 36 year old female senting for complaints of chest pain.  Patient admits to chest pain that began while smoking marijuana last night.  Chest pain is central in nature, with radiation down the left arm, was described as severe, with associated shortness of breath, no nausea or diaphoresis.  Denies history of fever, chills, coughing.    The history is provided by the patient. No language interpreter was used.  Chest Pain Associated symptoms: no abdominal pain, no back pain, no cough, no fever, no palpitations, no shortness of breath and no vomiting        Home Medications Prior to Admission medications   Medication Sig Start Date End Date Taking? Authorizing Provider  metroNIDAZOLE (FLAGYL) 500 MG tablet Take 1 tablet (500 mg total) by mouth 2 (two) times daily. 05/30/22   Warden Fillers, MD  valACYclovir (VALTREX) 500 MG tablet One PO BID for 5 days for herpes outbreak 05/28/22   Adam Phenix, MD  cetirizine (ZYRTEC ALLERGY) 10 MG tablet Take 1 tablet (10 mg total) by mouth daily. 08/30/20 02/16/21  Wallis Bamberg, PA-C      Allergies    Benadryl [diphenhydramine]    Review of Systems   Review of Systems  Constitutional:  Negative for chills and fever.  HENT:  Negative for ear pain and sore throat.   Eyes:  Negative for pain and visual disturbance.  Respiratory:  Negative for cough and shortness of breath.   Cardiovascular:  Positive for chest pain. Negative for palpitations.  Gastrointestinal:  Negative for abdominal pain and vomiting.  Genitourinary:  Negative for dysuria and hematuria.  Musculoskeletal:  Negative for arthralgias and back pain.  Skin:  Negative for color change and rash.  Neurological:  Negative for seizures and syncope.   All other systems reviewed and are negative.   Physical Exam Updated Vital Signs BP 114/82   Pulse 90   Temp 98.4 F (36.9 C)   Resp 18   Ht 5\' 2"  (1.575 m)   Wt 84.8 kg   LMP 06/28/2022   SpO2 98%   BMI 34.20 kg/m  Physical Exam Vitals and nursing note reviewed.  Constitutional:      General: She is not in acute distress.    Appearance: She is well-developed.  HENT:     Head: Normocephalic and atraumatic.  Eyes:     Conjunctiva/sclera: Conjunctivae normal.  Cardiovascular:     Rate and Rhythm: Normal rate and regular rhythm.     Heart sounds: No murmur heard. Pulmonary:     Effort: Pulmonary effort is normal. No respiratory distress.     Breath sounds: Normal breath sounds.  Abdominal:     Palpations: Abdomen is soft.     Tenderness: There is no abdominal tenderness.  Musculoskeletal:        General: No swelling.     Cervical back: Neck supple.  Skin:    General: Skin is warm and dry.     Capillary Refill: Capillary refill takes less than 2 seconds.  Neurological:     Mental Status: She is alert.  Psychiatric:        Mood and Affect: Mood normal.     ED Results / Procedures / Treatments  Labs (all labs ordered are listed, but only abnormal results are displayed) Labs Reviewed  BASIC METABOLIC PANEL - Abnormal; Notable for the following components:      Result Value   CO2 18 (*)    All other components within normal limits  TSH - Abnormal; Notable for the following components:   TSH <0.010 (*)    All other components within normal limits  RAPID URINE DRUG SCREEN, HOSP PERFORMED - Abnormal; Notable for the following components:   Cocaine POSITIVE (*)    Tetrahydrocannabinol POSITIVE (*)    All other components within normal limits  CBC  T4, FREE  I-STAT BETA HCG BLOOD, ED ([facility], [facility], [facility] ONLY)  TROPONIN I (HIGH SENSITIVITY)  TROPONIN I (HIGH SENSITIVITY)    EKG EKG Interpretation  Date/Time:  Friday June 28 2022 02:20:14 EDT Ventricular Rate:   142 PR Interval:  130 QRS Duration: 70 QT Interval:  290 QTC Calculation: 446 R Axis:   53 Text Interpretation: Sinus tachycardia Confirmed by Edwin Dada ([id number]) on 06/28/2022 7:05:27 AM  Radiology DG Chest 2 View  Result Date: 06/28/2022 CLINICAL DATA:  Chest pain and cough. EXAM: CHEST - 2 VIEW COMPARISON:  12/27/2020 FINDINGS: The heart size and mediastinal contours are within normal limits. Both lungs are clear. The visualized skeletal structures are unremarkable. IMPRESSION: No active cardiopulmonary disease. Electronically Signed   By: Burman Nieves M.D.   On: 06/28/2022 03:18    Procedures Procedures    Medications Ordered in ED Medications  ibuprofen (ADVIL) tablet 800 mg (has no administration in time range)    ED Course/ Medical Decision Making/ A&P                           Medical Decision Making Amount and/or Complexity of Data Reviewed Labs: ordered. Radiology: ordered.  Risk Prescription drug management.   16:83 AM 36 year old female senting for complaints of chest pain.  She is alert and oriented x3, no acute distress, afebrile, stable vital signs.  ECG demonstrates normal sinus tachycardia with otherwise stable intervals.  No ST segment elevation or depression.  Normal axis.  Has stable troponins, electrolytes, and chest x-ray.  UDS positive for marijuana and cocaine.  Patient symptoms likely secondary to cocaine induced vasospasms.  Patient recommended to stop smoking cocaine at this time.  TSH also low at this time.  Will follow up with T[facility].          Final Clinical Impression(s) / ED Diagnoses Final diagnoses:  None    Rx / DC Orders ED Discharge Orders     None         Franne Forts, DO 07/05/22 [phone]

## 2022-06-28 NOTE — ED Notes (Signed)
Pt appears to have calmed down and RR WDL - HR 125

## 2022-06-28 NOTE — ED Triage Notes (Addendum)
Pt reports she was laying down watching a movie tonight when she had sudden onset of left sided chest pain and palpitations, ongoing now for about 45 mins. She is hyperventilating in triage but was encouraged to slow her breathing down. HR 152 in triage. She also reports pain to her left arm. She does admit to "smoking a blunt" today.

## 2022-06-28 NOTE — ED Provider Triage Note (Signed)
Emergency Medicine Provider Triage Evaluation Note  Betty Thompson , a 36 y.o. female  was evaluated in triage.  Pt complains of chest pain started tonight while they were watching a movie, states that they smoke a blunt earlier, states that chest pain is middle of her chest will feel rating down her left arm feels short of breath, difficulty breathing, has history of anxiety possibly could be from this no cardiac history no history PEs or DVTs currently not on hormone therapy..  Review of Systems  Positive: Chest pain, shortness of breath Negative: Diaphoretic, edema  Physical Exam  BP 132/82 (BP Location: Right Arm)   Pulse (!) 152   Temp 98.4 F (36.9 C)   Resp 20   Ht 5\' 2"  (1.575 m)   Wt 84.8 kg   LMP 06/28/2022   SpO2 99%   BMI 34.20 kg/m  Gen:   Awake, no distress   Resp:  Normal effort  MSK:   Moves extremities without difficulty  Other:    Medical Decision Making  Medically screening exam initiated at 2:34 AM.  Appropriate orders placed.  Betty Thompson was informed that the remainder of the evaluation will be completed by another provider, this initial triage assessment does not replace that evaluation, and the importance of remaining in the ED until their evaluation is complete.  Presents with chest pain shortness of breath, she was tachycardic on arrival but during my personal evaluation heart rate was in the 130s, vital signs stable lab or imaging been ordered will need further work-up.   Alfredia Client, PA-C 06/28/22 [phone]

## 2022-06-28 NOTE — ED Notes (Signed)
Pt states she going out for a minute and will be in

## 2022-07-04 LAB — I-STAT BETA HCG BLOOD, ED (MC, WL, AP ONLY): I-stat hCG, quantitative: <5 m[IU]/mL (ref <5)

## 2022-08-26 ENCOUNTER — Ambulatory Visit
Admission: RE | Admit: 2022-08-26 | Discharge: 2022-08-26 | Disposition: A | Discharge status: Home / Self Care | Payer: Medicaid Other | Source: Ambulatory Visit | Attending: Urgent Care | Admitting: Urgent Care

## 2022-08-26 VITALS — BP 113/78 | HR 98 | Temp 98.9°F | Resp 18

## 2022-08-26 DIAGNOSIS — R899 Unspecified abnormal finding in specimens from other organs, systems and tissues: Secondary | ICD-10-CM

## 2022-08-26 DIAGNOSIS — F17200 Nicotine dependence, unspecified, uncomplicated: Secondary | ICD-10-CM | POA: Diagnosis not present

## 2022-08-26 DIAGNOSIS — R5383 Other fatigue: Secondary | ICD-10-CM | POA: Diagnosis not present

## 2022-08-26 DIAGNOSIS — F1210 Cannabis abuse, uncomplicated: Secondary | ICD-10-CM

## 2022-08-26 NOTE — ED Provider Notes (Signed)
[facility]   MRN: [id number] DOB: 1986-02-04  Subjective:   Betty Thompson is a 36 y.o. female presenting for persistent fatigue, malaise, intermittent dizziness.  Patient was seen at the end of June and was having chest pain at the time.  She was found to have abnormal thyroid levels suggestive of hyperthyroidism.  She has not followed up with anyone since then.  She admits that at the time she was using cocaine but has since stopped drug use.  She has not been drinking alcohol either.  Does not have a PCP.  Denies active chest pain, shortness of breath, heart racing.  She does admit to nervousness and anxiety about her medical health and in general.  Does not see a mental health specialist.  Still smokes marijuana daily.  No current facility-administered medications for this encounter.  Current Outpatient Medications:    metroNIDAZOLE (FLAGYL) 500 MG tablet, Take 1 tablet (500 mg total) by mouth 2 (two) times daily., Disp: 14 tablet, Rfl: 0   valACYclovir (VALTREX) 500 MG tablet, One PO BID for 5 days for herpes outbreak, Disp: 30 tablet, Rfl: 0   Allergies  Allergen Reactions   Benadryl [Diphenhydramine] Swelling    Past Medical History:  Diagnosis Date   Anxiety    Depression      Past Surgical History:  Procedure Laterality Date   CESAREAN SECTION     MOUTH SURGERY      Family History  Problem Relation Age of Onset   Heart disease Father    Heart attack Father    Stroke Father     Social History   Tobacco Use   Smoking status: Every Day    Packs/day: 0.50    Types: Cigarettes   Smokeless tobacco: Never   Tobacco comments:    1 pack per week  Vaping Use   Vaping Use: Never used  Substance Use Topics   Alcohol use: No   Drug use: Yes    Frequency: 7.0 times per week    Types: Marijuana    ROS   Objective:   Vitals: BP 113/78   Pulse 98   Temp 98.9 F (37.2 C)   Resp 18   SpO2 98%   Physical Exam Constitutional:       General: She is not in acute distress.    Appearance: Normal appearance. She is well-developed. She is not ill-appearing, toxic-appearing or diaphoretic.  HENT:     Head: Normocephalic and atraumatic.     Nose: Nose normal.     Mouth/Throat:     Mouth: Mucous membranes are moist.  Eyes:     General: No scleral icterus.       Right eye: No discharge.        Left eye: No discharge.     Extraocular Movements: Extraocular movements intact.  Neck:     Thyroid: No thyroid mass, thyromegaly or thyroid tenderness.  Cardiovascular:     Rate and Rhythm: Normal rate and regular rhythm.     Heart sounds: Normal heart sounds. No murmur heard.    No friction rub. No gallop.  Pulmonary:     Effort: Pulmonary effort is normal. No respiratory distress.     Breath sounds: No stridor. No wheezing, rhonchi or rales.  Chest:     Chest wall: No tenderness.  Skin:    General: Skin is warm and dry.  Neurological:     General: No focal deficit present.     Mental  Status: She is alert and oriented to person, place, and time.  Psychiatric:        Mood and Affect: Mood normal.        Behavior: Behavior normal.     Assessment and Plan :   PDMP not reviewed this encounter.  1. Other fatigue   2. Abnormality of labor   3. Marijuana abuse   4. Smoker   5. Abnormal laboratory test result    Unfortunately I mistakenly put abnormality of labor and associated the blood work with this diagnosis.  The correct diagnosis was abnormal laboratory test result.  I discussed with patient that we should pursue lab recheck.  Advised that she stop smoking marijuana as she does this daily.  For now we will hold off on medications until we can review her labs with her.  I did recommend that she establish care with [facility].  I also advised that if she continues to have abnormal thyroid levels that I would place a referral to endocrinology. Counseled patient on potential for adverse effects with  medications prescribed/recommended today, ER and return-to-clinic precautions discussed, patient verbalized understanding.    Wallis Bamberg, [place] 08/26/22 0350

## 2022-08-26 NOTE — ED Triage Notes (Signed)
Pt here with known hyperthyroidism and has not received treatment. Pt has been fatigued x several months with weight fluctuations.

## 2022-08-27 ENCOUNTER — Telehealth: Payer: Self-pay | Admitting: Urgent Care

## 2022-08-27 LAB — CBC WITH DIFFERENTIAL/PLATELET
Basophils Absolute: 0 10*3/uL (ref 0.0–0.2)
Basos: 0 %
EOS (ABSOLUTE): 0.5 10*3/uL — ABNORMAL HIGH (ref 0.0–0.4)
Eos: 7 %
Hematocrit: 38.7 % (ref 34.0–46.6)
Hemoglobin: 12.7 g/dL (ref 11.1–15.9)
Immature Grans (Abs): 0 10*3/uL (ref 0.0–0.1)
Immature Granulocytes: 0 %
Lymphocytes Absolute: 2.4 10*3/uL (ref 0.7–3.1)
Lymphs: 36 %
MCH: 28.2 pg (ref 26.6–33.0)
MCHC: 32.8 g/dL (ref 31.5–35.7)
MCV: 86 fL (ref 79–97)
Monocytes Absolute: 0.5 10*3/uL (ref 0.1–0.9)
Monocytes: 8 %
Neutrophils Absolute: 3.2 10*3/uL (ref 1.4–7.0)
Neutrophils: 49 %
Platelets: 272 10*3/uL (ref 150–450)
RBC: 4.51 x10E6/uL (ref 3.77–5.28)
RDW: 12.5 % (ref 11.7–15.4)
WBC: 6.6 10*3/uL (ref 3.4–10.8)

## 2022-08-27 LAB — T4, FREE: Free T4: 2.29 ng/dL — ABNORMAL HIGH (ref 0.82–1.77)

## 2022-08-27 LAB — TSH: TSH: <0.005 u[IU]/mL — ABNORMAL LOW (ref 0.450–4.500)

## 2022-08-27 NOTE — Telephone Encounter (Signed)
-----   Message from Aaron Edelman, RN sent at 08/27/2022  2:58 PM EDT ----- Per Urban Gibson, APP he will review with patient

## 2022-08-27 NOTE — Telephone Encounter (Signed)
I was unable to reach patient after 3 phone call attempts. Will place a referral to endocrinology for further testing and management of hyperthyroidism.

## 2022-09-11 IMAGING — US US PELVIS COMPLETE WITH TRANSVAGINAL
1 series · 15 of 25 positions shown · non-contrast
Comparison: None Available.

CLINICAL DATA: Menorrhagia



[Series 1: us pelvis complete · 15 of 106 slices shown]
[im 1/106]
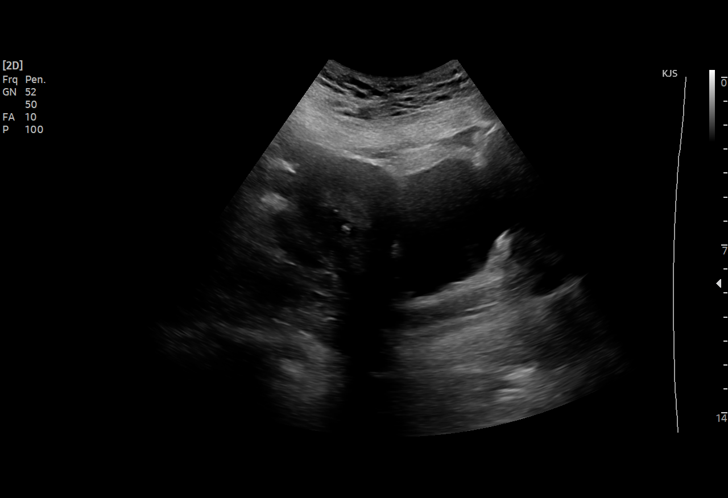
[im 9/106]
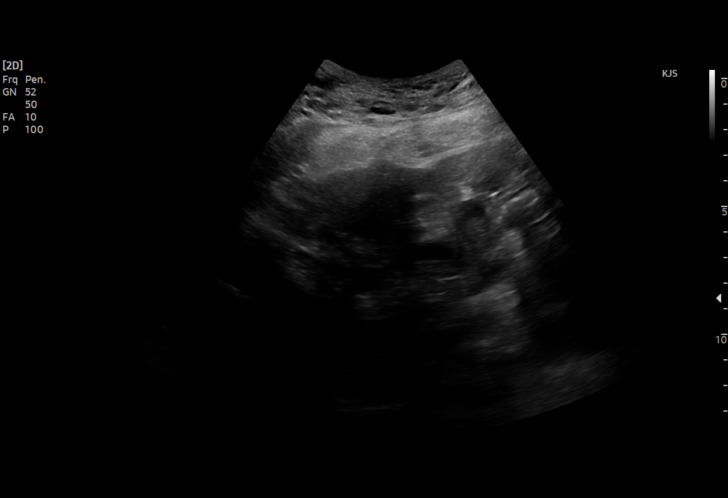
[im 18/106]
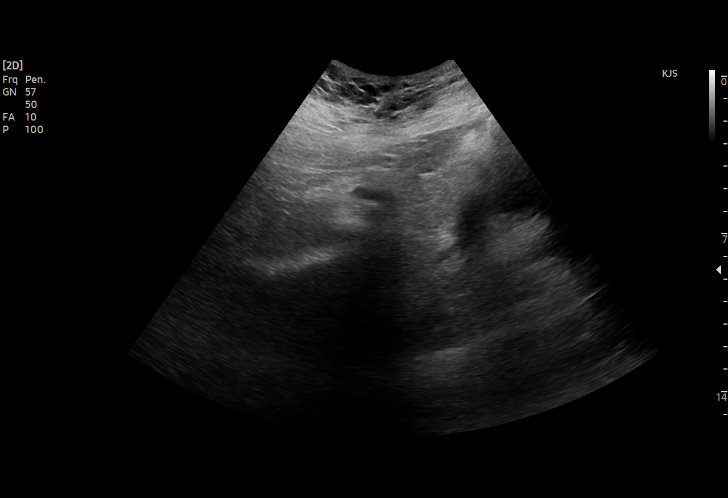
[im 22/106]
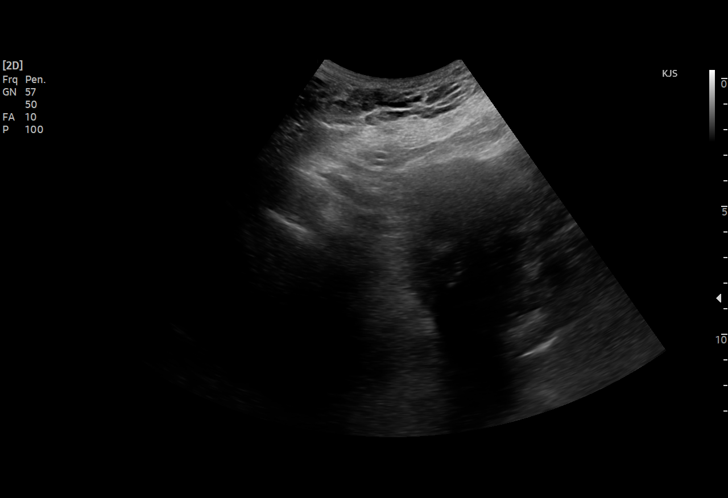
[im 31/106]
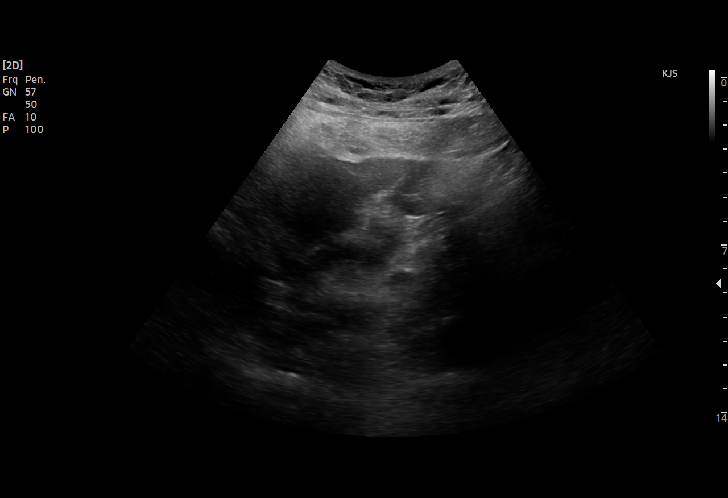
[im 40/106]
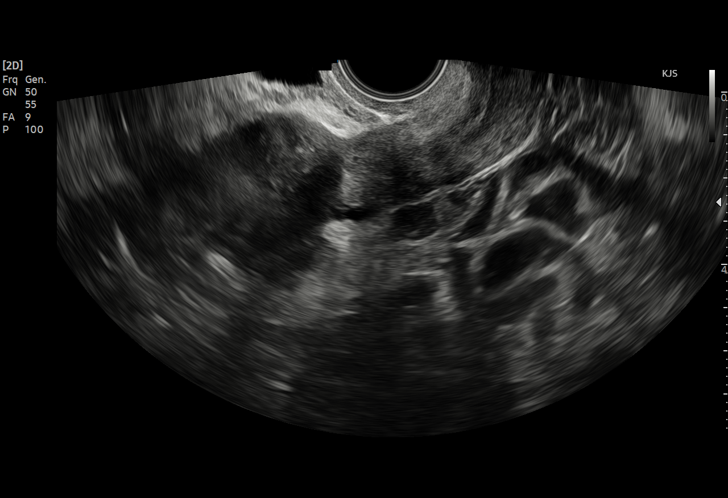
[im 44/106]
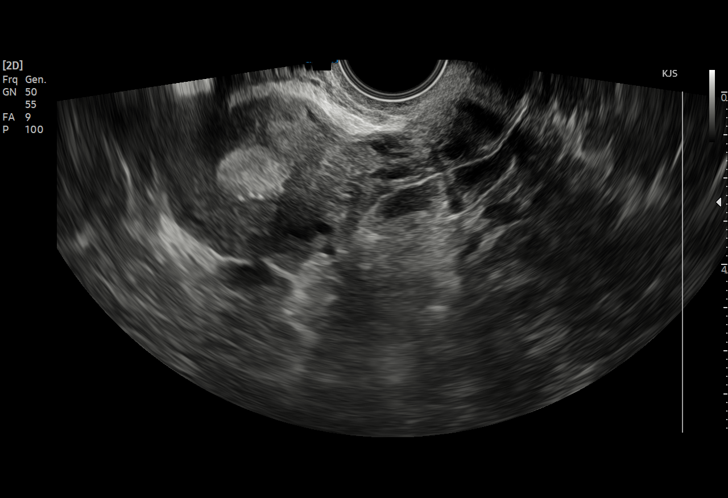
[im 53/106]
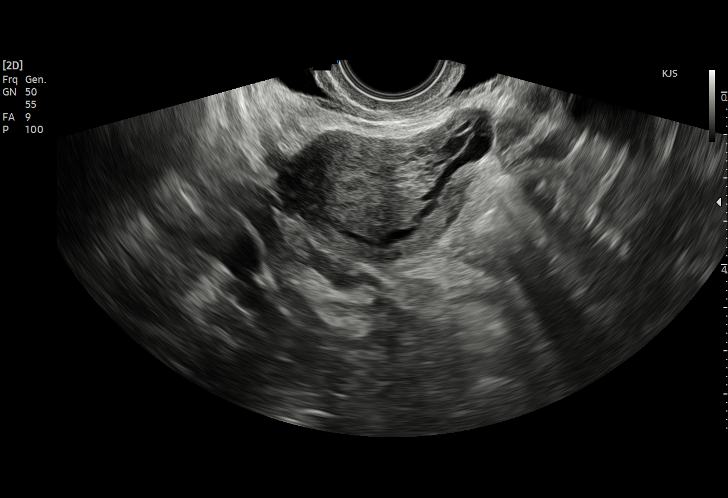
[im 62/106]
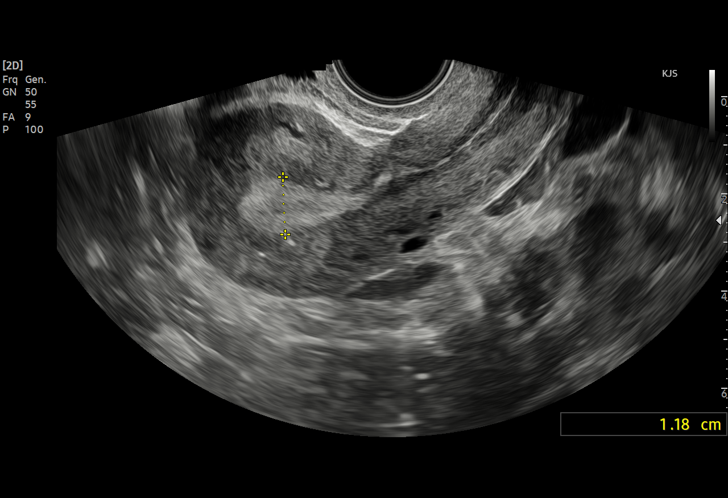
[im 66/106]
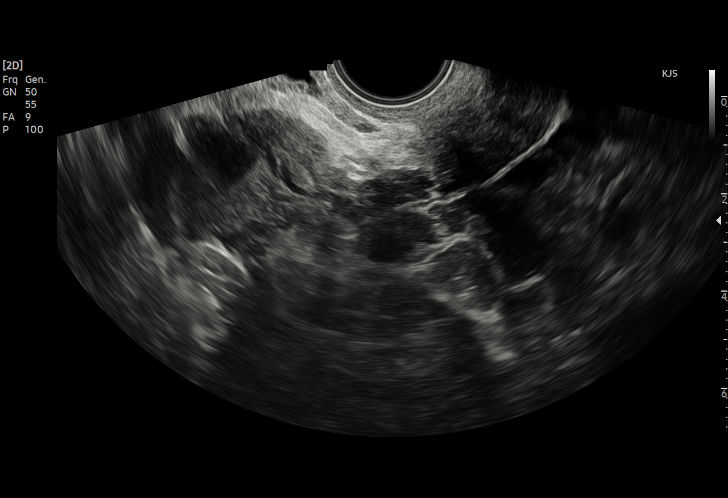
[im 75/106]
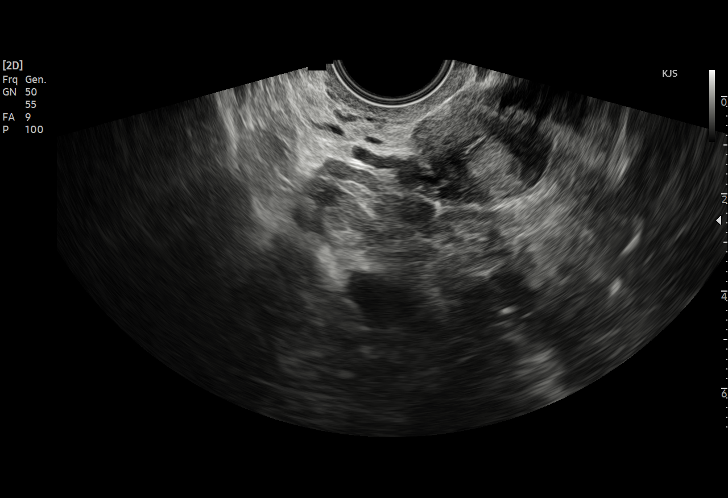
[im 84/106]
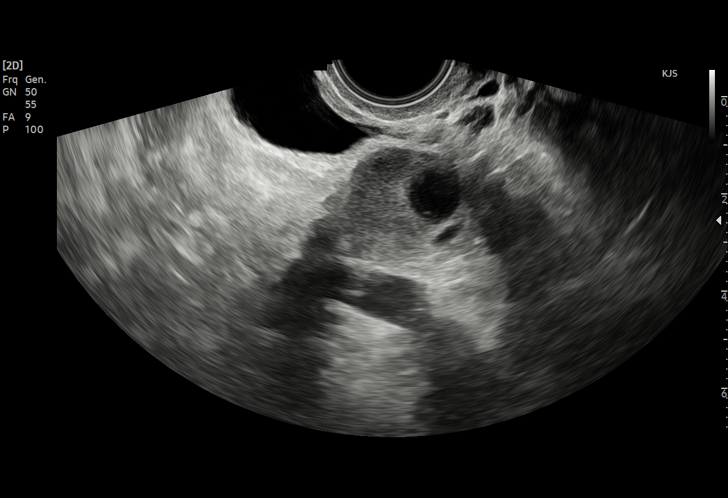
[im 88/106]
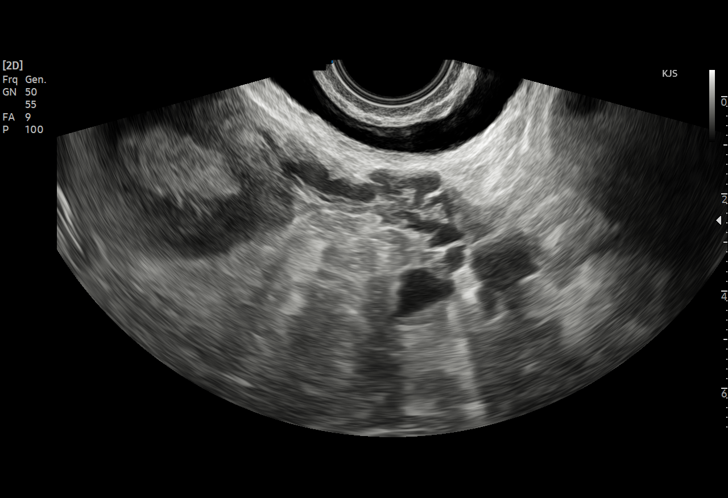
[im 97/106]
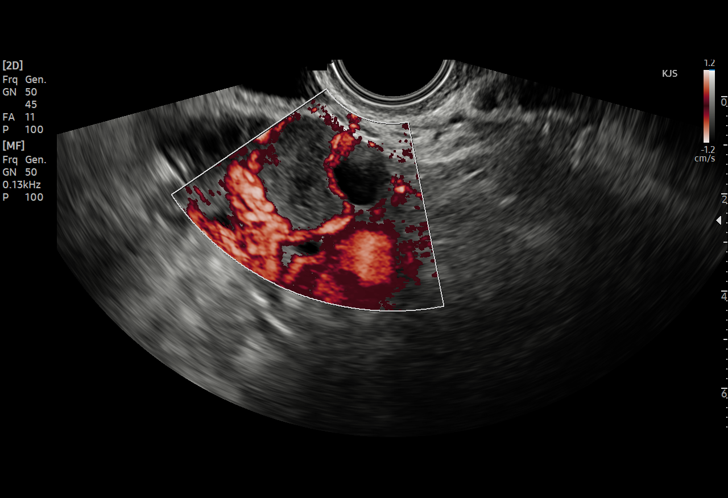
[im 106/106]
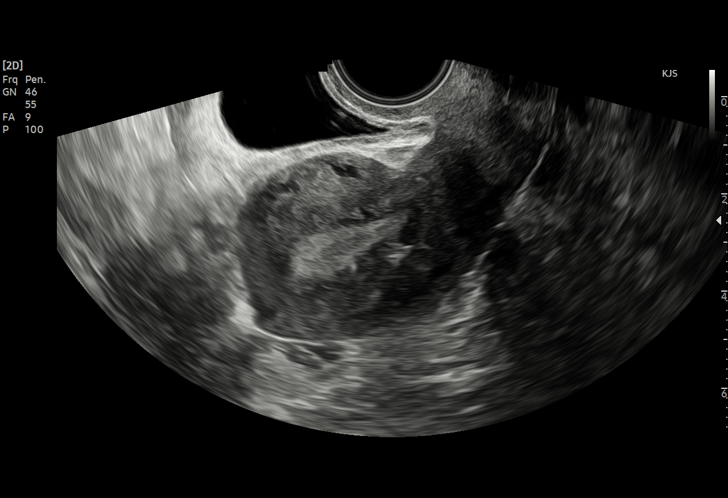

[15 of 25 positions shown; findings below may reference images not displayed]

FINDINGS: Uterus

Measurements: 9.8 x 3.6 x 5.6 cm = volume: 105.2 mL. No fibroids or
other mass visualized.

Endometrium

Thickness: 11 mm.  No focal abnormality visualized.

Right ovary

Not visualized

Left ovary

Measurements: 3.6 x 2.6 x 2.4 cm = volume: 11.8 mL. Normal
appearance/no adnexal mass.

Other findings

No abnormal free fluid.
IMPRESSION: Endometrium measures 11 mm. If bleeding remains unresponsive to
hormonal or medical therapy, sonohysterogram should be considered
for focal lesion work-up. (Ref: Radiological Reasoning: Algorithmic
Workup of Abnormal Vaginal Bleeding with Endovaginal Sonography and
Sonohysterography. [id number]; [id number])

## 2022-11-07 ENCOUNTER — Emergency Department (HOSPITAL_COMMUNITY)
Admission: EM | Admit: 2022-11-07 | Discharge: 2022-11-07 | Disposition: A | Discharge status: Home / Self Care | Payer: Medicaid Other | Attending: Emergency Medicine | Admitting: Emergency Medicine

## 2022-11-07 ENCOUNTER — Ambulatory Visit: Payer: Medicaid Other

## 2022-11-07 ENCOUNTER — Other Ambulatory Visit: Payer: Self-pay

## 2022-11-07 ENCOUNTER — Encounter (HOSPITAL_COMMUNITY): Payer: Self-pay | Admitting: Emergency Medicine

## 2022-11-07 DIAGNOSIS — J069 Acute upper respiratory infection, unspecified: Secondary | ICD-10-CM | POA: Diagnosis not present

## 2022-11-07 DIAGNOSIS — R197 Diarrhea, unspecified: Secondary | ICD-10-CM | POA: Diagnosis not present

## 2022-11-07 DIAGNOSIS — R079 Chest pain, unspecified: Secondary | ICD-10-CM | POA: Insufficient documentation

## 2022-11-07 DIAGNOSIS — R059 Cough, unspecified: Secondary | ICD-10-CM | POA: Diagnosis present

## 2022-11-07 LAB — HIV ANTIBODY (ROUTINE TESTING W REFLEX): HIV Screen 4th Generation wRfx: NONREACTIVE

## 2022-11-07 NOTE — ED Triage Notes (Signed)
Patient arrived via [facility] from home for chest wall pain, cough, and diarrhea.  Daughter arrived with patient and is being seen for same.  No meds PTA.  No meds given by EMS.
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
## 2022-11-07 NOTE — ED Provider Notes (Signed)
[facility] EMERGENCY DEPARTMENT Provider Note   CSN: [id number] Arrival date & time: 11/07/22  0935     History  Chief Complaint  Patient presents with   Cough   Chest Pain   Diarrhea    Betty Thompson is a 36 y.o. female.   Cough Associated symptoms: chest pain   Chest Pain Associated symptoms: cough   Diarrhea Patient cough chest pain diarrhea.  Has had for around 2 days.  Daughter has similar symptoms.  Both had contact with the same person who had "pneumonia".  Patient is worried she has pneumonia.  No fevers.  Chest pain is dull.  Worse with certain breathing.    Past Medical History:  Diagnosis Date   Anxiety    Depression     Home Medications Prior to Admission medications   Medication Sig Start Date End Date Taking? Authorizing Provider  metroNIDAZOLE (FLAGYL) 500 MG tablet Take 1 tablet (500 mg total) by mouth 2 (two) times daily. 05/30/22   Warden Fillers, MD  valACYclovir (VALTREX) 500 MG tablet One PO BID for 5 days for herpes outbreak 05/28/22   Adam Phenix, MD  cetirizine (ZYRTEC ALLERGY) 10 MG tablet Take 1 tablet (10 mg total) by mouth daily. 08/30/20 02/16/21  Wallis Bamberg, PA-C      Allergies    Benadryl [diphenhydramine]    Review of Systems   Review of Systems  Respiratory:  Positive for cough.   Cardiovascular:  Positive for chest pain.  Gastrointestinal:  Positive for diarrhea.    Physical Exam Updated Vital Signs BP 121/85 (BP Location: Left Arm)   Pulse 90   Temp 98.4 F (36.9 C) (Oral)   Resp 16   Wt 89 kg   SpO2 100%   BMI 35.89 kg/m  Physical Exam Vitals and nursing note reviewed.  HENT:     Head:     Comments: Nasal congestion. Cardiovascular:     Rate and Rhythm: Normal rate and regular rhythm.  Pulmonary:     Breath sounds: No wheezing, rhonchi or rales.     Comments: No focal rales or rhonchi. Musculoskeletal:     Right lower leg: No edema.     Left lower leg: No edema.  Skin:    Capillary  Refill: Capillary refill takes less than 2 seconds.  Neurological:     Mental Status: She is alert.     ED Results / Procedures / Treatments   Labs (all labs ordered are listed, but only abnormal results are displayed) Labs Reviewed  HIV ANTIBODY (ROUTINE TESTING W REFLEX)    EKG EKG Interpretation  Date/Time:  Thursday November 07 2022 09:55:25 EST Ventricular Rate:  86 PR Interval:  140 QRS Duration: 88 QT Interval:  350 QTC Calculation: 418 R Axis:   20 Text Interpretation: Sinus rhythm with Premature supraventricular complexes Otherwise normal ECG When compared with ECG of 28-Jun-2022 02:20, rate improved Confirmed by Benjiman Core [phone]) on 11/07/2022 10:13:09 AM  Radiology No results found.  Procedures Procedures    Medications Ordered in ED Medications - No data to display  ED Course/ Medical Decision Making/ A&P                           Medical Decision Making Patient with URI symptoms.  Cough.  Daughter is similar symptoms.  Lungs clear.  Doubt pneumonia.  Had negative COVID test at home.  Also has had flu and COVID within  the last 2 months per patient.  Doubt significant pneumonia at this time.  Not think need antibiotics.  EKG done and reassuring.  Doubt cardiac ischemia or myocarditis. Will be able to discharge home.  Patient later states that she was worried of HIV.  States some other friend made statements that worried her.  Will check HIV but will not wait on results.        Final Clinical Impression(s) / ED Diagnoses Final diagnoses:  Upper respiratory tract infection, unspecified type  Diarrhea, unspecified type    Rx / DC Orders ED Discharge Orders     None         Davonna Belling, MD 11/07/22 1025

## 2022-12-15 ENCOUNTER — Other Ambulatory Visit: Payer: Self-pay | Admitting: Obstetrics & Gynecology

## 2022-12-15 DIAGNOSIS — Z01419 Encounter for gynecological examination (general) (routine) without abnormal findings: Secondary | ICD-10-CM

## 2022-12-15 DIAGNOSIS — Z8619 Personal history of other infectious and parasitic diseases: Secondary | ICD-10-CM

## 2022-12-20 ENCOUNTER — Other Ambulatory Visit (HOSPITAL_COMMUNITY): Payer: Self-pay

## 2022-12-20 MED ORDER — VALACYCLOVIR HCL 500 MG PO TABS
500.0000 mg | ORAL_TABLET | Freq: Two times a day (BID) | ORAL | 0 refills | Status: AC
  Filled 2022-12-20 – 2023-02-22 (×3): qty 30, 15d supply, fill #0

## 2023-01-02 ENCOUNTER — Ambulatory Visit (HOSPITAL_COMMUNITY)
Admission: EM | Admit: 2023-01-02 | Discharge: 2023-01-02 | Discharge status: Against Medical Advice | Payer: Medicaid Other | Attending: Family Medicine | Admitting: Family Medicine

## 2023-01-02 NOTE — ED Notes (Signed)
Patient called via phone and from the lobby by other CMA for triage with no response.  

## 2023-01-02 NOTE — ED Notes (Signed)
Called pt on phone number, she states she is in lobby called from lobby no answer

## 2023-01-04 ENCOUNTER — Other Ambulatory Visit (HOSPITAL_COMMUNITY): Payer: Self-pay

## 2023-01-08 ENCOUNTER — Ambulatory Visit (HOSPITAL_COMMUNITY)
Admission: EM | Admit: 2023-01-08 | Discharge: 2023-01-08 | Disposition: A | Discharge status: Home / Self Care | Payer: Self-pay | Attending: Family Medicine | Admitting: Family Medicine

## 2023-01-08 ENCOUNTER — Encounter (HOSPITAL_COMMUNITY): Payer: Self-pay

## 2023-01-08 DIAGNOSIS — K529 Noninfective gastroenteritis and colitis, unspecified: Secondary | ICD-10-CM

## 2023-01-08 LAB — POCT URINALYSIS DIPSTICK, ED / UC
Bilirubin Urine: NEGATIVE
Glucose, UA: NEGATIVE mg/dL
Ketones, ur: NEGATIVE mg/dL
Leukocytes,Ua: NEGATIVE
Nitrite: NEGATIVE
Protein, ur: 30 mg/dL — AB
Specific Gravity, Urine: >=1.03 (ref 1.005–1.030)
Urobilinogen, UA: 0.2 mg/dL (ref 0.0–1.0)
pH: 6.5 (ref 5.0–8.0)

## 2023-01-08 LAB — POC URINE PREG, ED: Preg Test, Ur: NEGATIVE

## 2023-01-08 MED ORDER — ONDANSETRON 4 MG PO TBDP: 4.0000 mg | ORAL_TABLET | Freq: Three times a day (TID) | ORAL | 0 refills | Status: DC | PRN

## 2023-01-08 MED ORDER — DICYCLOMINE HCL 20 MG PO TABS: 20.0000 mg | ORAL_TABLET | Freq: Four times a day (QID) | ORAL | 0 refills | Status: AC | PRN

## 2023-01-08 NOTE — Discharge Instructions (Signed)
The urine shows some blood.  Pregnancy test was negative.  I do wonder if your period might be starting since we could see that blood in the urine  Dicyclomine--take 1 every 6 hours as needed for intestinal cramps  Ondansetron dissolved in the mouth every 8 hours as needed for nausea or vomiting. Clear liquids and bland things to eat.  Please present to the emergency room if you are not keeping fluids down or if your abdominal pain intensifies.

## 2023-01-08 NOTE — ED Provider Notes (Signed)
[facility]    CSN: [id number] Arrival date & time: 01/08/23  1234      History   Chief Complaint Chief Complaint  Patient presents with   Emesis   Diarrhea    HPI Betty Thompson is a 37 y.o. female.    Emesis Associated symptoms: diarrhea   Diarrhea Associated symptoms: vomiting    Here for nausea and vomiting and diarrhea.  Symptoms began on January 6.  She has not had any fever and she has not had any upper respiratory symptoms.  No dysuria and no back pain.  She has had some upper abdominal pain and cramping.  Last menstrual cycle was December 1, possibly.  She does not remember having a period since December 25  Past Medical History:  Diagnosis Date   Anxiety    Depression     Patient Active Problem List   Diagnosis Date Noted   History of herpes genitalis 05/28/2022   Menorrhagia with regular cycle 05/28/2022    Past Surgical History:  Procedure Laterality Date   CESAREAN SECTION     MOUTH SURGERY      OB History     Gravida  3   Para  3   Term  3   Preterm      AB      Living  3      SAB      IAB      Ectopic      Multiple      Live Births  3            Home Medications    Prior to Admission medications   Medication Sig Start Date End Date Taking? Authorizing Provider  dicyclomine (BENTYL) 20 MG tablet Take 1 tablet (20 mg total) by mouth 4 (four) times daily as needed (intestinal cramps). 01/08/23  Yes Zenia Resides, MD  ondansetron (ZOFRAN-ODT) 4 MG disintegrating tablet Take 1 tablet (4 mg total) by mouth every 8 (eight) hours as needed for nausea or vomiting. 01/08/23  Yes Damon Baisch, Janace Aris, MD  valACYclovir (VALTREX) 500 MG tablet Take 1 tablet (500 mg total) by mouth 2 (two) times daily for 5 days for herpes outbreak 12/20/22   Constant, Gigi Gin, MD  cetirizine (ZYRTEC ALLERGY) 10 MG tablet Take 1 tablet (10 mg total) by mouth daily. 08/30/20 02/16/21  Wallis Bamberg, PA-C    Family History Family History   Problem Relation Age of Onset   Heart disease Father    Heart attack Father    Stroke Father     Social History Social History   Tobacco Use   Smoking status: Every Day    Packs/day: 0.50    Types: Cigarettes   Smokeless tobacco: Never   Tobacco comments:    1 pack per week  Vaping Use   Vaping Use: Never used  Substance Use Topics   Alcohol use: No   Drug use: Yes    Frequency: 7.0 times per week    Types: Marijuana     Allergies   Benadryl [diphenhydramine]   Review of Systems Review of Systems  Gastrointestinal:  Positive for diarrhea and vomiting.     Physical Exam Triage Vital Signs ED Triage Vitals  Enc Vitals Group     BP 01/08/23 1351 121/72     Pulse Rate 01/08/23 1351 87     Resp 01/08/23 1351 16     Temp 01/08/23 1351 98.3 F (36.8 C)     Temp  Source 01/08/23 1351 Oral     SpO2 01/08/23 1351 97 %     Weight 01/08/23 1351 188 lb (85.3 kg)     Height 01/08/23 1351 5\' 1"  (1.549 m)     Head Circumference --      Peak Flow --      Pain Score 01/08/23 1350 10     Pain Loc --      Pain Edu? --      Excl. in [place]? --    No data found.  Updated Vital Signs BP 121/72 (BP Location: Right Arm)   Pulse 87   Temp 98.3 F (36.8 C) (Oral)   Resp 16   Ht 5\' 1"  (1.549 m)   Wt 85.3 kg   LMP 11/29/2022 (Approximate)   SpO2 97%   BMI 35.52 kg/m   Visual Acuity Right Eye Distance:   Left Eye Distance:   Bilateral Distance:    Right Eye Near:   Left Eye Near:    Bilateral Near:     Physical Exam Vitals reviewed.  Constitutional:      General: She is not in acute distress.    Appearance: She is not ill-appearing, toxic-appearing or diaphoretic.  HENT:     Mouth/Throat:     Mouth: Mucous membranes are moist.     Pharynx: No oropharyngeal exudate or posterior oropharyngeal erythema.  Eyes:     Extraocular Movements: Extraocular movements intact.     Conjunctiva/sclera: Conjunctivae normal.     Pupils: Pupils are equal, round, and reactive  to light.  Cardiovascular:     Rate and Rhythm: Normal rate and regular rhythm.     Heart sounds: No murmur heard. Pulmonary:     Effort: Pulmonary effort is normal. No respiratory distress.     Breath sounds: No stridor. No wheezing, rhonchi or rales.  Abdominal:     General: There is no distension.     Palpations: Abdomen is soft. There is no mass.     Tenderness: There is abdominal tenderness (epigastric, LUQ/RUQ).     Comments: No tenderness in the lower quadrants  Musculoskeletal:     Cervical back: Neck supple.  Lymphadenopathy:     Cervical: No cervical adenopathy.  Skin:    Capillary Refill: Capillary refill takes less than 2 seconds.     Coloration: Skin is not jaundiced or pale.  Neurological:     General: No focal deficit present.     Mental Status: She is alert and oriented to person, place, and time.  Psychiatric:        Behavior: Behavior normal.      UC Treatments / Results  Labs (all labs ordered are listed, but only abnormal results are displayed) Labs Reviewed  POCT URINALYSIS DIPSTICK, ED / UC - Abnormal; Notable for the following components:      Result Value   Hgb urine dipstick LARGE (*)    Protein, ur 30 (*)    All other components within normal limits  POC URINE PREG, ED    EKG   Radiology No results found.  Procedures Procedures (including critical care time)  Medications Ordered in UC Medications - No data to display  Initial Impression / Assessment and Plan / UC Course  I have reviewed the triage vital signs and the nursing notes.  Pertinent labs & imaging results that were available during my care of the patient were reviewed by me and considered in my medical decision making (see chart for details).  Urine shows some blood.  No pyuria. Going to treat with Zofran and dicyclomine for the symptoms of probable gastroenteritis.  She is being advised to try some Imodium.  If not improving she should report to the emergency  room Final Clinical Impressions(s) / UC Diagnoses   Final diagnoses:  Gastroenteritis     Discharge Instructions      The urine shows some blood.  Pregnancy test was negative.  I do wonder if your period might be starting since we could see that blood in the urine  Dicyclomine--take 1 every 6 hours as needed for intestinal cramps  Ondansetron dissolved in the mouth every 8 hours as needed for nausea or vomiting. Clear liquids and bland things to eat.  Please present to the emergency room if you are not keeping fluids down or if your abdominal pain intensifies.     ED Prescriptions     Medication Sig Dispense Auth. Provider   ondansetron (ZOFRAN-ODT) 4 MG disintegrating tablet Take 1 tablet (4 mg total) by mouth every 8 (eight) hours as needed for nausea or vomiting. 10 tablet Barrett Henle, MD   dicyclomine (BENTYL) 20 MG tablet Take 1 tablet (20 mg total) by mouth 4 (four) times daily as needed (intestinal cramps). 40 tablet Marchella Hibbard, Gwenlyn Perking, MD      PDMP not reviewed this encounter.   Barrett Henle, MD 01/08/23 1440

## 2023-01-08 NOTE — ED Triage Notes (Addendum)
Chief Complaint: Emesis, Nausea, Loss of Appetite. Patient states unable to keep any solids or fluids down. Patient having abdominal pain and diarrhea. Weakness as well. Rash on the right breast, no new products or a bug bite. No recent antibiotics or raw meats.   Onset: 3 days   Prescriptions or OTC medications tried: No    Sick exposure: Yes- her daughter had pneumonia and RSV.   New foods, medications, or products: No  Recent Travel: No

## 2023-01-16 ENCOUNTER — Other Ambulatory Visit (HOSPITAL_COMMUNITY): Payer: Self-pay

## 2023-01-30 ENCOUNTER — Other Ambulatory Visit (HOSPITAL_COMMUNITY): Payer: Self-pay

## 2023-02-03 ENCOUNTER — Emergency Department (HOSPITAL_COMMUNITY)
Admission: EM | Admit: 2023-02-03 | Discharge: 2023-02-03 | Disposition: A | Discharge status: Home / Self Care | Payer: Medicaid Other | Attending: Emergency Medicine | Admitting: Emergency Medicine

## 2023-02-03 DIAGNOSIS — Y998 Other external cause status: Secondary | ICD-10-CM | POA: Diagnosis not present

## 2023-02-03 DIAGNOSIS — Y9389 Activity, other specified: Secondary | ICD-10-CM | POA: Diagnosis not present

## 2023-02-03 DIAGNOSIS — W2189XA Striking against or struck by other sports equipment, initial encounter: Secondary | ICD-10-CM | POA: Insufficient documentation

## 2023-02-03 DIAGNOSIS — Y9289 Other specified places as the place of occurrence of the external cause: Secondary | ICD-10-CM | POA: Diagnosis not present

## 2023-02-03 DIAGNOSIS — S0592XA Unspecified injury of left eye and orbit, initial encounter: Secondary | ICD-10-CM | POA: Diagnosis present

## 2023-02-03 DIAGNOSIS — S0502XA Injury of conjunctiva and corneal abrasion without foreign body, left eye, initial encounter: Secondary | ICD-10-CM | POA: Diagnosis not present

## 2023-02-03 MED ORDER — ERYTHROMYCIN 5 MG/GM OP OINT
TOPICAL_OINTMENT | OPHTHALMIC | 0 refills | Status: DC
Start: 2023-02-03 — End: 2023-05-06

## 2023-02-03 MED ORDER — FLUORESCEIN SODIUM 1 MG OP STRP
1.0000 | ORAL_STRIP | Freq: Once | OPHTHALMIC | Status: AC
  Administered 2023-02-03: 1 via OPHTHALMIC
  Filled 2023-02-03: qty 1

## 2023-02-03 MED ORDER — TETRACAINE HCL 0.5 % OP SOLN
2.0000 [drp] | Freq: Once | OPHTHALMIC | Status: AC
  Administered 2023-02-03: 2 [drp] via OPHTHALMIC
  Filled 2023-02-03: qty 4

## 2023-02-03 NOTE — Discharge Instructions (Addendum)
Your should heal itself from the small cut that was seen on the exam today.  You can wear sunglasses out in the sunlight to protect your eye.  Otherwise you do not need eyepatch or any type of cover up over your eye.  You can take ibuprofen as needed for pain for the next few days, but generally pain does resolve fairly quickly within a week after an eye injury.  If you have new or worsening pain, loss of vision, drainage from your eyes, or any other emergency medical concern, please return to the emergency room
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
## 2023-02-22 ENCOUNTER — Other Ambulatory Visit (HOSPITAL_COMMUNITY): Payer: Self-pay

## 2023-05-06 ENCOUNTER — Encounter: Payer: Self-pay | Admitting: Internal Medicine

## 2023-05-06 ENCOUNTER — Ambulatory Visit (INDEPENDENT_AMBULATORY_CARE_PROVIDER_SITE_OTHER): Payer: Medicaid Other | Admitting: Internal Medicine

## 2023-05-06 VITALS — BP 118/72 | HR 100 | Ht 61.0 in | Wt 212.2 lb

## 2023-05-06 DIAGNOSIS — N926 Irregular menstruation, unspecified: Secondary | ICD-10-CM

## 2023-05-06 DIAGNOSIS — E559 Vitamin D deficiency, unspecified: Secondary | ICD-10-CM | POA: Diagnosis not present

## 2023-05-06 DIAGNOSIS — E0590 Thyrotoxicosis, unspecified without thyrotoxic crisis or storm: Secondary | ICD-10-CM | POA: Diagnosis not present

## 2023-05-06 LAB — CBC WITH DIFFERENTIAL/PLATELET
Basophils Absolute: 0 10*3/uL (ref 0.0–0.1)
Basophils Relative: 0.3 % (ref 0.0–3.0)
Eosinophils Absolute: 0.5 10*3/uL (ref 0.0–0.7)
Eosinophils Relative: 9.3 % — ABNORMAL HIGH (ref 0.0–5.0)
HCT: 39.8 % (ref 36.0–46.0)
Hemoglobin: 13.4 g/dL (ref 12.0–15.0)
Lymphocytes Relative: 42.8 % (ref 12.0–46.0)
Lymphs Abs: 2.5 10*3/uL (ref 0.7–4.0)
MCHC: 33.8 g/dL (ref 30.0–36.0)
MCV: 83.7 fl (ref 78.0–100.0)
Monocytes Absolute: 0.4 10*3/uL (ref 0.1–1.0)
Monocytes Relative: 7.6 % (ref 3.0–12.0)
Neutro Abs: 2.3 10*3/uL (ref 1.4–7.7)
Neutrophils Relative %: 40 % — ABNORMAL LOW (ref 43.0–77.0)
Platelets: 286 10*3/uL (ref 150.0–400.0)
RBC: 4.75 Mil/uL (ref 3.87–5.11)
RDW: 12.8 % (ref 11.5–15.5)
WBC: 5.8 10*3/uL (ref 4.0–10.5)

## 2023-05-06 LAB — COMPREHENSIVE METABOLIC PANEL
ALT: 12 U/L (ref 0–35)
AST: 15 U/L (ref 0–37)
Albumin: 4.1 g/dL (ref 3.5–5.2)
Alkaline Phosphatase: 58 U/L (ref 39–117)
BUN: 8 mg/dL (ref 6–23)
CO2: 26 mEq/L (ref 19–32)
Calcium: 9.2 mg/dL (ref 8.4–10.5)
Chloride: 106 mEq/L (ref 96–112)
Creatinine, Ser: 0.62 mg/dL (ref 0.40–1.20)
GFR: 115.57 mL/min (ref >60.00)
Glucose, Bld: 90 mg/dL (ref 70–99)
Potassium: 3.5 mEq/L (ref 3.5–5.1)
Sodium: 139 mEq/L (ref 135–145)
Total Bilirubin: 0.5 mg/dL (ref 0.2–1.2)
Total Protein: 7.9 g/dL (ref 6.0–8.3)

## 2023-05-06 LAB — T4, FREE: Free T4: 1.42 ng/dL (ref 0.60–1.60)

## 2023-05-06 LAB — TSH: TSH: 0 u[IU]/mL — ABNORMAL LOW (ref 0.35–5.50)

## 2023-05-06 LAB — FOLLICLE STIMULATING HORMONE: FSH: 3.6 m[IU]/mL

## 2023-05-06 LAB — VITAMIN D 25 HYDROXY (VIT D DEFICIENCY, FRACTURES): VITD: 7.96 ng/mL — ABNORMAL LOW (ref 30.00–100.00)

## 2023-05-06 LAB — LUTEINIZING HORMONE: LH: 4.5 m[IU]/mL

## 2023-05-06 NOTE — Progress Notes (Unsigned)
Name: Betty Thompson  MRN/ DOB: [id number], 02-08-1986    Age/ Sex: 37 y.o., female    PCP: Patient, [name]   Reason for Endocrinology Evaluation: Hyperthyroidism     Date of Initial Endocrinology Evaluation: 05/06/2023     HPI: Betty Thompson is a 37 y.o. female with a past medical history of Hyperthyroidism. The patient presented for initial endocrinology clinic visit on 05/06/2023 for consultative assistance with her Hyperthyroidism.   Patient has been diagnosed with hyperthyroidism in 07/2022 with suppressed TSH <0.005 u IU/mL, elevated free T4 at 2.29 NG/DL  Patient has been following up with GYN for menorrhagia  She is here with a female companion She has been noted with weight gain over the past 4 months  She continues with fatigue  Has noted excessive hunger  Has noted irregular periods  No Biotin  Has occasional palpitations and tremors  Has insomnia  Has noted irritability  Has noted local neck enlargement but she is  not sure if its weight gain  She  has occasional dysphagia  and insomnia  NO eye symptoms  Father with multiple health issues with questionable   HISTORY:  Past Medical History:  Past Medical History:  Diagnosis Date   Anxiety    Depression    Past Surgical History:  Past Surgical History:  Procedure Laterality Date   CESAREAN SECTION     MOUTH SURGERY      Social History:  reports that she has been smoking cigarettes. She has been smoking an average of .5 packs per day. She has never used smokeless tobacco. She reports current drug use. Frequency: 7.00 times per week. Drug: Marijuana. She reports that she does not drink alcohol. Family History: family history includes Heart attack in her father; Heart disease in her father; Stroke in her father.   HOME MEDICATIONS: Allergies as of 05/06/2023       Reactions   Benadryl [diphenhydramine] Swelling        Medication List        Accurate as of May 06, 2023 11:00 AM. If you have any  questions, ask your nurse or doctor.          STOP taking these medications    erythromycin ophthalmic ointment Stopped by: Scarlette Shorts, MD   ondansetron 4 MG disintegrating tablet Commonly known as: ZOFRAN-ODT Stopped by: Scarlette Shorts, MD       TAKE these medications    dicyclomine 20 MG tablet Commonly known as: BENTYL Take 1 tablet (20 mg total) by mouth 4 (four) times daily as needed (intestinal cramps).   valACYclovir 500 MG tablet Commonly known as: Valtrex Take 1 tablet (500 mg total) by mouth 2 (two) times daily for 5 days for herpes outbreak          REVIEW OF SYSTEMS: A comprehensive ROS was conducted with the patient and is negative except as per HPI     OBJECTIVE:  VS: BP 118/72 (BP Location: Left Arm, Patient Position: Sitting, Cuff Size: Large)   Pulse 100   Ht 5\' 1"  (1.549 m)   Wt 212 lb 3.2 oz (96.3 kg)   SpO2 99%   BMI 40.09 kg/m    Wt Readings from Last 3 Encounters:  05/06/23 212 lb 3.2 oz (96.3 kg)  01/08/23 188 lb (85.3 kg)  11/07/22 196 lb 3.4 oz (89 kg)     EXAM: General: Pt is tearful  Eyes: External eye exam normal without stare, lid  lag or exophthalmos.  EOM intact.   Neck: General: Supple without adenopathy. Thyroid: Thyroid size normal.  No goiter or nodules appreciated. No thyroid bruit.  Lungs: Clear with good BS bilat   Heart: Auscultation: RRR.  Abdomen: Soft, nontender  Extremities:  BL LE: No pretibial edema   Mental Status: Judgment, insight: Intact Orientation: Oriented to time, place, and person Mood and affect: No depression, anxiety, or agitation     DATA REVIEWED:  Latest Reference Range & Units 05/06/23 11:20  Sodium 135 - 145 mEq/L 139  Potassium 3.5 - 5.1 mEq/L 3.5  Chloride 96 - 112 mEq/L 106  CO2 19 - 32 mEq/L 26  Glucose 70 - 99 mg/dL 90  BUN 6 - 23 mg/dL 8  Creatinine 1.61 - 0.96 mg/dL 0.45  Calcium 8.4 - 40.9 mg/dL 9.2  Alkaline Phosphatase 39 - 117 U/L 58  Albumin 3.5 -  5.2 g/dL 4.1  AST 0 - 37 U/L 15  ALT 0 - 35 U/L 12  Total Protein 6.0 - 8.3 g/dL 7.9  Total Bilirubin 0.2 - 1.2 mg/dL 0.5  GFR >81.19 mL/min 115.57      Latest Reference Range & Units 05/06/23 11:20  VITD 30.00 - 100.00 ng/mL 7.96 (L)  WBC 4.0 - 10.5 K/uL 5.8  RBC 3.87 - 5.11 Mil/uL 4.75  Hemoglobin 12.0 - 15.0 g/dL 14.7  HCT 82.9 - 56.2 % 39.8  MCV 78.0 - 100.0 fl 83.7  MCHC 30.0 - 36.0 g/dL 13.0  RDW 86.5 - 78.4 % 12.8  Platelets 150.0 - 400.0 K/uL 286.0  Neutrophils 43.0 - 77.0 % 40.0 (L)  Lymphocytes 12.0 - 46.0 % 42.8  Monocytes Relative 3.0 - 12.0 % 7.6  Eosinophil 0.0 - 5.0 % 9.3 (H)  Basophil 0.0 - 3.0 % 0.3  NEUT# 1.4 - 7.7 K/uL 2.3  Lymphocyte # 0.7 - 4.0 K/uL 2.5  Monocyte # 0.1 - 1.0 K/uL 0.4  Eosinophils Absolute 0.0 - 0.7 K/uL 0.5  Basophils Absolute 0.0 - 0.1 K/uL 0.0  LH mIU/mL 4.50  FSH mIU/ML 3.6  Prolactin ng/mL 5.8  Glucose 70 - 99 mg/dL 90  Estradiol pg/mL 696  TSH 0.35 - 5.50 uIU/mL 0.00 Repeated and verified X2. (L)  Triiodothyronine (T3) 76 - 181 ng/dL 295 (H)  M8,UXLK(GMWNUU) 0.60 - 1.60 ng/dL 7.25   ASSESSMENT/PLAN/RECOMMENDATIONS:   Hyperthyroidism:  -She initially had typical presentation with hyperthyroid symptoms, but recently she has been noted with weight gain and increased appetite -We discussed differential diagnosis to include Graves' disease versus toxic thyroid nodule and to less extent subacute thyroiditis -Her TFTs show slight improvement but continues with suppressed TSH and elevated T3 -Will prescribe methimazole as below  Medications : Start methimazole 5 mg daily    2.  Irregular menstruation:  -She has had workup through GYN, per patient workup has been negative -She is concerned about fertility in the future -LH, FSH, prolactin, and estradiol are normal -Advised the patient that if she is interested in fertility, she will need to follow-up with GYN    3.  Vitamin D deficiency   -We will replenish with  ergocalciferol 50,000 IU weekly  Labs in 2 months Follow-up in 4 months  Addendum: Attempted to call the patient on 05/07/2023 at 11:30 AM, no answer, voicemail left to check portal   Signed electronically by: Lyndle Herrlich, MD  [facility] Endocrinology  [facility] [address] [place] [place], [place] [address] Phone: [phone] FAX: [phone]   CC:  Patient, [name] No address on file Phone: None Fax: None   Return to Endocrinology clinic as below: Future Appointments  Date Time Provider Department Center  05/06/2023 11:10 AM Krystel Fletchall, Konrad Dolores, MD [facility] None

## 2023-05-07 ENCOUNTER — Encounter: Payer: Self-pay | Admitting: Internal Medicine

## 2023-05-07 ENCOUNTER — Other Ambulatory Visit: Payer: Self-pay

## 2023-05-07 DIAGNOSIS — E0590 Thyrotoxicosis, unspecified without thyrotoxic crisis or storm: Secondary | ICD-10-CM | POA: Insufficient documentation

## 2023-05-07 DIAGNOSIS — E559 Vitamin D deficiency, unspecified: Secondary | ICD-10-CM | POA: Insufficient documentation

## 2023-05-07 DIAGNOSIS — N926 Irregular menstruation, unspecified: Secondary | ICD-10-CM | POA: Insufficient documentation

## 2023-05-07 LAB — T3: T3, Total: 217 ng/dL — ABNORMAL HIGH (ref 76–181)

## 2023-05-07 LAB — PROLACTIN: Prolactin: 5.8 ng/mL

## 2023-05-07 LAB — ESTRADIOL: Estradiol: 267 pg/mL

## 2023-05-07 MED ORDER — VITAMIN D (ERGOCALCIFEROL) 1.25 MG (50000 UNIT) PO CAPS
50000.0000 [IU] | ORAL_CAPSULE | ORAL | 3 refills | Status: DC
  Filled 2023-05-07: qty 12, 84d supply, fill #0
  Filled 2023-05-11 – 2023-10-01 (×4): qty 12, 84d supply, fill #1

## 2023-05-07 MED ORDER — METHIMAZOLE 5 MG PO TABS
5.0000 mg | ORAL_TABLET | Freq: Every day | ORAL | 3 refills | Status: DC
  Filled 2023-05-07: qty 90, 90d supply, fill #0
  Filled 2023-10-01: qty 90, 90d supply, fill #1

## 2023-05-12 ENCOUNTER — Other Ambulatory Visit: Payer: Self-pay

## 2023-05-17 ENCOUNTER — Other Ambulatory Visit (HOSPITAL_COMMUNITY): Payer: Self-pay

## 2023-05-21 ENCOUNTER — Ambulatory Visit: Payer: Medicaid Other | Admitting: Internal Medicine

## 2023-05-27 ENCOUNTER — Other Ambulatory Visit: Payer: Self-pay

## 2023-05-28 ENCOUNTER — Emergency Department (HOSPITAL_COMMUNITY)
Admission: EM | Admit: 2023-05-28 | Discharge: 2023-05-29 | Discharge status: Against Medical Advice | Payer: Medicaid Other | Attending: Emergency Medicine | Admitting: Emergency Medicine

## 2023-05-28 ENCOUNTER — Encounter (HOSPITAL_COMMUNITY): Payer: Self-pay | Admitting: *Deleted

## 2023-05-28 ENCOUNTER — Other Ambulatory Visit: Payer: Self-pay

## 2023-05-28 DIAGNOSIS — R197 Diarrhea, unspecified: Secondary | ICD-10-CM | POA: Insufficient documentation

## 2023-05-28 DIAGNOSIS — R109 Unspecified abdominal pain: Secondary | ICD-10-CM | POA: Insufficient documentation

## 2023-05-28 DIAGNOSIS — Z5321 Procedure and treatment not carried out due to patient leaving prior to being seen by health care provider: Secondary | ICD-10-CM | POA: Insufficient documentation

## 2023-05-28 NOTE — ED Triage Notes (Signed)
The  pt has had abd pain with diarrhea for  2-3 days

## 2023-05-29 LAB — I-STAT BETA HCG BLOOD, ED (MC, WL, AP ONLY): I-stat hCG, quantitative: <5 m[IU]/mL (ref <5)

## 2023-05-29 NOTE — ED Notes (Signed)
Pt left on own accord 

## 2023-06-03 ENCOUNTER — Ambulatory Visit
Admission: RE | Admit: 2023-06-03 | Discharge: 2023-06-03 | Disposition: A | Discharge status: Home / Self Care | Payer: Medicaid Other | Source: Ambulatory Visit | Attending: Internal Medicine | Admitting: Internal Medicine

## 2023-06-03 DIAGNOSIS — E0590 Thyrotoxicosis, unspecified without thyrotoxic crisis or storm: Secondary | ICD-10-CM

## 2023-07-15 ENCOUNTER — Other Ambulatory Visit: Payer: Medicaid Other

## 2023-07-18 ENCOUNTER — Other Ambulatory Visit: Payer: Medicaid Other

## 2023-07-23 ENCOUNTER — Other Ambulatory Visit (INDEPENDENT_AMBULATORY_CARE_PROVIDER_SITE_OTHER): Payer: Medicaid Other

## 2023-07-23 DIAGNOSIS — E0590 Thyrotoxicosis, unspecified without thyrotoxic crisis or storm: Secondary | ICD-10-CM

## 2023-07-23 LAB — TSH: TSH: <0.01 u[IU]/mL — ABNORMAL LOW (ref 0.35–5.50)

## 2023-07-23 LAB — T4, FREE: Free T4: 0.83 ng/dL (ref 0.60–1.60)

## 2023-07-28 ENCOUNTER — Encounter: Payer: Self-pay | Admitting: Internal Medicine

## 2023-09-08 ENCOUNTER — Ambulatory Visit: Payer: Medicaid Other | Admitting: Internal Medicine

## 2023-09-08 NOTE — Progress Notes (Deleted)
Name: Betty Thompson  MRN/ DOB: [id number], 1986-08-12    Age/ Sex: 37 y.o., female    PCP: Patient, [name]   Reason for Endocrinology Evaluation: Hyperthyroidism     Date of Initial Endocrinology Evaluation: 05/06/2023    HPI: [name] is a 37 y.o. female with a past medical history of Hyperthyroidism. The patient presented for initial endocrinology clinic visit on 05/06/2023  for consultative assistance with her Hyperthyroidism.   Patient has been diagnosed with hyperthyroidism in 07/2022 with suppressed TSH <0.005 u IU/mL, elevated free T4 at 2.29 NG/DL  Patient has been following up with GYN for menorrhagia   Father with multiple health issues, unclear thyroid issues  She was started on methimazole 04/2023 with a suppressed TSH and elevated T3   She was started on vitamin D 04/2023 with a vitamin D level at 7.96 NG/mL  SUBJECTIVE:    Today (09/08/23):  [name] is here for a follow up on hyperthyroidism.   She continues with fatigue  Has noted irregular periods  No Biotin  Has occasional palpitations and tremors  Has insomnia  Has noted irritability  Has noted local neck enlargement  NO eye symptoms  Patient follows with GYN  Methimazole 5 mg daily Ergocalciferol 50,000 weekly   HISTORY:  Past Medical History:  Past Medical History:  Diagnosis Date   Anxiety    Depression    Past Surgical History:  Past Surgical History:  Procedure Laterality Date   CESAREAN SECTION     MOUTH SURGERY      Social History:  reports that she has been smoking cigarettes. She has never used smokeless tobacco. She reports current drug use. Frequency: 7.00 times per week. Drug: Marijuana. She reports that she does not drink alcohol. Family History: family history includes Heart attack in her father; Heart disease in her father; Stroke in her father.   HOME MEDICATIONS: Allergies as of 09/08/2023       Reactions   Benadryl [diphenhydramine] Swelling         Medication List        Accurate as of September 08, 2023  7:15 AM. If you have any questions, ask your nurse or doctor.          dicyclomine 20 MG tablet Commonly known as: BENTYL Take 1 tablet (20 mg total) by mouth 4 (four) times daily as needed (intestinal cramps).   methimazole 5 MG tablet Commonly known as: TAPAZOLE Take 1 tablet (5 mg total) by mouth daily.   valACYclovir 500 MG tablet Commonly known as: Valtrex Take 1 tablet (500 mg total) by mouth 2 (two) times daily for 5 days for herpes outbreak   Vitamin D (Ergocalciferol) 1.25 MG (50000 UNIT) Caps capsule Commonly known as: DRISDOL Take 1 capsule (50,000 Units total) by mouth every 7 (seven) days.          REVIEW OF SYSTEMS: A comprehensive ROS was conducted with the patient and is negative except as per HPI     OBJECTIVE:  VS: There were no vitals taken for this visit.   Wt Readings from Last 3 Encounters:  05/28/23 206 lb 5.6 oz (93.6 kg)  05/06/23 212 lb 3.2 oz (96.3 kg)  01/08/23 188 lb (85.3 kg)     EXAM: General: Pt is tearful  Eyes: External eye exam normal without stare, lid lag or exophthalmos.  EOM intact.   Neck: General: Supple without adenopathy. Thyroid: Thyroid size normal.  No goiter or nodules  appreciated. No thyroid bruit.  Lungs: Clear with good BS bilat   Heart: Auscultation: RRR.  Abdomen: Soft, nontender  Extremities:  BL LE: No pretibial edema   Mental Status: Judgment, insight: Intact Orientation: Oriented to time, place, and person Mood and affect: No depression, anxiety, or agitation     DATA REVIEWED:  Latest Reference Range & Units 05/06/23 11:20  Sodium 135 - 145 mEq/L 139  Potassium 3.5 - 5.1 mEq/L 3.5  Chloride 96 - 112 mEq/L 106  CO2 19 - 32 mEq/L 26  Glucose 70 - 99 mg/dL 90  BUN 6 - 23 mg/dL 8  Creatinine 2.95 - 2.84 mg/dL 1.32  Calcium 8.4 - 44.0 mg/dL 9.2  Alkaline Phosphatase 39 - 117 U/L 58  Albumin 3.5 - 5.2 g/dL 4.1  AST 0 - 37 U/L 15  ALT  0 - 35 U/L 12  Total Protein 6.0 - 8.3 g/dL 7.9  Total Bilirubin 0.2 - 1.2 mg/dL 0.5  GFR >10.27 mL/min 115.57      Latest Reference Range & Units 05/06/23 11:20  VITD 30.00 - 100.00 ng/mL 7.96 (L)  WBC 4.0 - 10.5 K/uL 5.8  RBC 3.87 - 5.11 Mil/uL 4.75  Hemoglobin 12.0 - 15.0 g/dL 25.3  HCT 66.4 - 40.3 % 39.8  MCV 78.0 - 100.0 fl 83.7  MCHC 30.0 - 36.0 g/dL 47.4  RDW 25.9 - 56.3 % 12.8  Platelets 150.0 - 400.0 K/uL 286.0  Neutrophils 43.0 - 77.0 % 40.0 (L)  Lymphocytes 12.0 - 46.0 % 42.8  Monocytes Relative 3.0 - 12.0 % 7.6  Eosinophil 0.0 - 5.0 % 9.3 (H)  Basophil 0.0 - 3.0 % 0.3  NEUT# 1.4 - 7.7 K/uL 2.3  Lymphocyte # 0.7 - 4.0 K/uL 2.5  Monocyte # 0.1 - 1.0 K/uL 0.4  Eosinophils Absolute 0.0 - 0.7 K/uL 0.5  Basophils Absolute 0.0 - 0.1 K/uL 0.0  LH mIU/mL 4.50  FSH mIU/ML 3.6  Prolactin ng/mL 5.8  Glucose 70 - 99 mg/dL 90  Estradiol pg/mL 875  TSH 0.35 - 5.50 uIU/mL 0.00 Repeated and verified X2. (L)  Triiodothyronine (T3) 76 - 181 ng/dL 643 (H)  P2,RJJO(ACZYSA) 0.60 - 1.60 ng/dL 6.30   ASSESSMENT/PLAN/RECOMMENDATIONS:   Hyperthyroidism:  -She initially had typical presentation with hyperthyroid symptoms, but recently she has been noted with weight gain and increased appetite -We discussed differential diagnosis to include Graves' disease versus toxic thyroid nodule and to less extent subacute thyroiditis -Her TFTs show slight improvement but continues with suppressed TSH and elevated T3 -Will prescribe methimazole as below  Medications : Start methimazole 5 mg daily   2.  Vitamin D deficiency   -We will replenish with ergocalciferol 50,000 IU weekly  Labs in 2 months Follow-up in 4 months   Signed electronically by: Lyndle Herrlich, MD  [facility] Endocrinology  [facility] [address] [place] [place], [place] [address] Phone: [phone] FAX: [phone]   CC: Patient, [name] No address on file Phone: None Fax:  None   Return to Endocrinology clinic as below: Future Appointments  Date Time Provider Department Center  09/08/2023 12:10 PM Ferol Laiche, Konrad Dolores, MD [facility] None

## 2023-10-01 ENCOUNTER — Encounter (HOSPITAL_COMMUNITY): Payer: Self-pay | Admitting: Emergency Medicine

## 2023-10-01 ENCOUNTER — Ambulatory Visit (HOSPITAL_COMMUNITY)
Admission: EM | Admit: 2023-10-01 | Discharge: 2023-10-01 | Disposition: A | Discharge status: Home / Self Care | Payer: Medicaid Other | Attending: Internal Medicine | Admitting: Internal Medicine

## 2023-10-01 DIAGNOSIS — Z3202 Encounter for pregnancy test, result negative: Secondary | ICD-10-CM | POA: Diagnosis not present

## 2023-10-01 LAB — POCT URINE PREGNANCY: Preg Test, Ur: NEGATIVE

## 2023-10-01 NOTE — ED Provider Notes (Signed)
[facility]    CSN: [id number] Arrival date & time: 10/01/23  1134      History   Chief Complaint Chief Complaint  Patient presents with   Requesting pregnancy test    HPI Gayna Braddy is a 37 y.o. female.   Patient presents to urgent care for pregnancy testing.  Her last menstrual cycle was last week around September 23, 2023, however she reports her cycle was much shorter and lighter than normal.  Menstrual cycles are usually regular and last 7 days, her last menstrual cycle only lasted 2 days.  She is concerned for possible pregnancy and requesting pregnancy test here today.  No urinary symptoms, concern for STD, vaginal symptoms, abdominal pain, nausea, vomiting, dizziness.  She has not taken a pregnancy test at home prior to arrival.     Past Medical History:  Diagnosis Date   Anxiety    Depression     Patient Active Problem List   Diagnosis Date Noted   Vitamin D deficiency 05/07/2023   Irregular menstruation 05/07/2023   Hyperthyroidism 05/07/2023   History of herpes genitalis 05/28/2022   Menorrhagia with regular cycle 05/28/2022    Past Surgical History:  Procedure Laterality Date   CESAREAN SECTION     MOUTH SURGERY      OB History     Gravida  3   Para  3   Term  3   Preterm      AB      Living  3      SAB      IAB      Ectopic      Multiple      Live Births  3            Home Medications    Prior to Admission medications   Medication Sig Start Date End Date Taking? Authorizing Provider  methimazole (TAPAZOLE) 5 MG tablet Take 1 tablet (5 mg total) by mouth daily. 05/07/23  Yes Shamleffer, Konrad Dolores, MD  dicyclomine (BENTYL) 20 MG tablet Take 1 tablet (20 mg total) by mouth 4 (four) times daily as needed (intestinal cramps). 01/08/23   Zenia Resides, MD  valACYclovir (VALTREX) 500 MG tablet Take 1 tablet (500 mg total) by mouth 2 (two) times daily for 5 days for herpes outbreak 12/20/22   Constant,  Peggy, MD  Vitamin D, Ergocalciferol, (DRISDOL) 1.25 MG (50000 UNIT) CAPS capsule Take 1 capsule (50,000 Units total) by mouth every 7 (seven) days. 05/07/23   Shamleffer, Konrad Dolores, MD  cetirizine (ZYRTEC ALLERGY) 10 MG tablet Take 1 tablet (10 mg total) by mouth daily. 08/30/20 02/16/21  Wallis Bamberg, PA-C    Family History Family History  Problem Relation Age of Onset   Heart disease Father    Heart attack Father    Stroke Father     Social History Social History   Tobacco Use   Smoking status: Every Day    Current packs/day: 0.50    Types: Cigarettes   Smokeless tobacco: Never   Tobacco comments:    1 pack per week  Vaping Use   Vaping status: Never Used  Substance Use Topics   Alcohol use: No   Drug use: Yes    Frequency: 7.0 times per week    Types: Marijuana     Allergies   Benadryl [diphenhydramine]   Review of Systems Review of Systems Per HPI  Physical Exam Triage Vital Signs ED Triage Vitals  Encounter Vitals Group  BP 10/01/23 1200 (!) 126/90     Systolic BP Percentile --      Diastolic BP Percentile --      Pulse Rate 10/01/23 1200 75     Resp 10/01/23 1200 16     Temp 10/01/23 1200 97.8 F (36.6 C)     Temp Source 10/01/23 1200 Oral     SpO2 10/01/23 1200 97 %     Weight --      Height --      Head Circumference --      Peak Flow --      Pain Score 10/01/23 1201 0     Pain Loc --      Pain Education --      Exclude from Growth Chart --    No data found.  Updated Vital Signs BP (!) 126/90 (BP Location: Left Arm)   Pulse 75   Temp 97.8 F (36.6 C) (Oral)   Resp 16   SpO2 97%   Visual Acuity Right Eye Distance:   Left Eye Distance:   Bilateral Distance:    Right Eye Near:   Left Eye Near:    Bilateral Near:     Physical Exam Vitals and nursing note reviewed.  Constitutional:      Appearance: She is not ill-appearing or toxic-appearing.  HENT:     Head: Normocephalic and atraumatic.     Right Ear: Hearing and  external ear normal.     Left Ear: Hearing and external ear normal.     Nose: Nose normal.     Mouth/Throat:     Lips: Pink.  Eyes:     General: Lids are normal. Vision grossly intact. Gaze aligned appropriately.     Extraocular Movements: Extraocular movements intact.     Conjunctiva/sclera: Conjunctivae normal.  Pulmonary:     Effort: Pulmonary effort is normal.  Musculoskeletal:     Cervical back: Neck supple.  Skin:    General: Skin is warm and dry.     Capillary Refill: Capillary refill takes less than 2 seconds.     Findings: No rash.  Neurological:     General: No focal deficit present.     Mental Status: She is alert and oriented to person, place, and time. Mental status is at baseline.     Cranial Nerves: No dysarthria or facial asymmetry.  Psychiatric:        Mood and Affect: Mood normal.        Speech: Speech normal.        Behavior: Behavior normal.        Thought Content: Thought content normal.        Judgment: Judgment normal.      UC Treatments / Results  Labs (all labs ordered are listed, but only abnormal results are displayed) Labs Reviewed  POCT URINE PREGNANCY    EKG   Radiology No results found.  Procedures Procedures (including critical care time)  Medications Ordered in UC Medications - No data to display  Initial Impression / Assessment and Plan / UC Course  I have reviewed the triage vital signs and the nursing notes.  Pertinent labs & imaging results that were available during my care of the patient were reviewed by me and considered in my medical decision making (see chart for details).   1.  Negative pregnancy test Urine pregnancy test is negative in clinic.  May follow-up with primary care as needed.  Counseled patient on potential for adverse effects with  medications prescribed/recommended today, strict ER and return-to-clinic precautions discussed, patient verbalized understanding.   Final Clinical Impressions(s) / UC  Diagnoses   Final diagnoses:  Negative pregnancy test     Discharge Instructions      Pregnancy test negative.  Follow-up with PCP as needed.   ED Prescriptions   None    PDMP not reviewed this encounter.   Carlisle Beers, [place] 10/01/23 2105

## 2023-10-01 NOTE — ED Triage Notes (Signed)
4 days ago noticed new breast tenderness, fatigue, urinating more, had a lighter cycle.

## 2023-10-01 NOTE — Discharge Instructions (Signed)
Pregnancy test negative.  Follow-up with PCP as needed.

## 2023-10-02 ENCOUNTER — Other Ambulatory Visit: Payer: Self-pay | Admitting: Obstetrics and Gynecology

## 2023-10-02 ENCOUNTER — Other Ambulatory Visit (HOSPITAL_COMMUNITY): Payer: Self-pay

## 2023-10-02 DIAGNOSIS — Z01419 Encounter for gynecological examination (general) (routine) without abnormal findings: Secondary | ICD-10-CM

## 2023-10-02 DIAGNOSIS — Z8619 Personal history of other infectious and parasitic diseases: Secondary | ICD-10-CM

## 2023-10-03 ENCOUNTER — Encounter: Payer: Self-pay | Admitting: Internal Medicine

## 2023-10-03 ENCOUNTER — Other Ambulatory Visit: Payer: Self-pay

## 2023-10-03 ENCOUNTER — Ambulatory Visit (INDEPENDENT_AMBULATORY_CARE_PROVIDER_SITE_OTHER): Payer: Medicaid Other | Admitting: Internal Medicine

## 2023-10-03 VITALS — BP 120/72 | HR 82 | Ht 60.0 in | Wt 220.0 lb

## 2023-10-03 DIAGNOSIS — E0590 Thyrotoxicosis, unspecified without thyrotoxic crisis or storm: Secondary | ICD-10-CM

## 2023-10-03 DIAGNOSIS — E559 Vitamin D deficiency, unspecified: Secondary | ICD-10-CM

## 2023-10-03 LAB — VITAMIN D 25 HYDROXY (VIT D DEFICIENCY, FRACTURES): VITD: 11.02 ng/mL — ABNORMAL LOW (ref 30.00–100.00)

## 2023-10-03 LAB — T4, FREE: Free T4: 0.91 ng/dL (ref 0.60–1.60)

## 2023-10-03 LAB — TSH: TSH: 0.02 u[IU]/mL — ABNORMAL LOW (ref 0.35–5.50)

## 2023-10-03 NOTE — Progress Notes (Unsigned)
Name: Betty Thompson  MRN/ DOB: [id number], [birth year]    Age/ Sex: 37 y.o., female    PCP: Patient, [name]   Reason for Endocrinology Evaluation: Hyperthyroidism     Date of Initial Endocrinology Evaluation: 05/06/2023    HPI: [name] is a 37 y.o. female with a past medical history of Hyperthyroidism. The patient presented for initial endocrinology clinic visit on 05/06/2023  for consultative assistance with her Hyperthyroidism.   Patient has been diagnosed with hyperthyroidism in 07/2022 with suppressed TSH <0.005 u IU/mL, elevated free T4 at 2.29 NG/DL  Patient has been following up with GYN for menorrhagia   Father with multiple health issues, unclear thyroid issues  She was started on methimazole 04/2023 with a suppressed TSH and elevated T3   She was started on vitamin D 04/2023 with a vitamin D level at 7.96 NG/mL  Thyroid ultrasound 05/2023 did not reveal any thyroid nodules   SUBJECTIVE:    Today (10/03/23):  Betty Thompson is here for a follow up on hyperthyroidism.   Weight has been fluctuating  Has been out of Methimazole for 3 days  Denies local neck swelling  Denies palpitations  Has rare tremors  Denies constipation , but has diarrhea  LMP has been regular 09/26/2023, noted spotting 10/3rd  Has noted vomiting and metallic taste and fatigue, suspects pregnancy  Patient follows with GYN  Methimazole 5 mg daily Ergocalciferol 50,000 weekly   HISTORY:  Past Medical History:  Past Medical History:  Diagnosis Date   Anxiety    Depression    Past Surgical History:  Past Surgical History:  Procedure Laterality Date   CESAREAN SECTION     MOUTH SURGERY      Social History:  reports that she has been smoking cigarettes. She has never used smokeless tobacco. She reports current drug use. Frequency: 7.00 times per week. Drug: Marijuana. She reports that she does not drink alcohol. Family History: family history includes Heart attack in her father;  Heart disease in her father; Stroke in her father.   HOME MEDICATIONS: Allergies as of 10/03/2023       Reactions   Benadryl [diphenhydramine] Swelling        Medication List        Accurate as of October 03, 2023 10:12 AM. If you have any questions, ask your nurse or doctor.          dicyclomine 20 MG tablet Commonly known as: BENTYL Take 1 tablet (20 mg total) by mouth 4 (four) times daily as needed (intestinal cramps).   methimazole 5 MG tablet Commonly known as: TAPAZOLE Take 1 tablet (5 mg total) by mouth daily.   valACYclovir 500 MG tablet Commonly known as: Valtrex Take 1 tablet (500 mg total) by mouth 2 (two) times daily for 5 days for herpes outbreak   Vitamin D (Ergocalciferol) 1.25 MG (50000 UNIT) Caps capsule Commonly known as: DRISDOL Take 1 capsule (50,000 Units total) by mouth every 7 (seven) days.          REVIEW OF SYSTEMS: A comprehensive ROS was conducted with the patient and is negative except as per HPI     OBJECTIVE:  VS: BP 120/72 (BP Location: Left Arm, Patient Position: Sitting, Cuff Size: Large)   Pulse 82   Ht 5' (1.524 m)   Wt 220 lb (99.8 kg)   SpO2 99%   BMI 42.97 kg/m    Wt Readings from Last 3 Encounters:  10/03/23 220 lb (  99.8 kg)  05/28/23 206 lb 5.6 oz (93.6 kg)  05/06/23 212 lb 3.2 oz (96.3 kg)     EXAM: General: Pt is tearful  Eyes: External eye exam normal without stare, lid lag or exophthalmos.  EOM intact.   Neck: General: Supple without adenopathy. Thyroid: Thyroid size normal.  No goiter or nodules appreciated. No thyroid bruit.  Lungs: Clear with good BS bilat   Heart: Auscultation: RRR.  Abdomen: Soft, nontender  Extremities:  BL LE: No pretibial edema   Mental Status: Judgment, insight: Intact Orientation: Oriented to time, place, and person Mood and affect: No depression, anxiety, or agitation     DATA REVIEWED:   Latest Reference Range & Units 10/03/23 10:36  VITD 30.00 - 100.00 ng/mL 11.02  (L)  (L): Data is abnormally low   Latest Reference Range & Units 10/03/23 10:36  hCG Quant mIU/mL <1  TSH 0.35 - 5.50 uIU/mL 0.02 (L)  T4,Free(Direct) 0.60 - 1.60 ng/dL 8.65  (L): Data is abnormally low    Thyroid Ultrasound 06/03/2023   Estimated total number of nodules >/= 1 cm: 0   Number of spongiform nodules >/=  2 cm not described below (TR1): 0   Number of mixed cystic and solid nodules >/= 1.5 cm not described below (TR2): 0   _________________________________________________________   No discrete nodules are seen within the thyroid gland. Vascularity of the thyroid parenchyma is subjectively normal. There are some mildly prominent cervical lymph nodes noted bilaterally demonstrating normal lymph node architecture. Although each has short axis dimensions of approximately 1 cm, both right and left cervical lymph nodes are fairly elongated. These most likely represent benign or reactive lymph nodes. Correlation suggested with any underlying inflammatory disease or evidence of lymphoproliferative disease.   IMPRESSION: 1. Mildly enlarged and heterogeneous thyroid gland without focal nodules identified. 2. Mildly prominent bilateral cervical lymph nodes demonstrating normal lymph node architecture. Correlation suggested with any underlying inflammatory disease or evidence of lymphoproliferative disease.        ASSESSMENT/PLAN/RECOMMENDATIONS:   Hyperthyroidism:  -TSH remains low, I am concerned about imperfect adherence -Patient advised to obtain a pillbox -I will increase methimazole as below -Patient has symptoms compatible pregnancy, hCG negative    Medications : Increase methimazole 5 mg, 2 tabs on Saturdays 2 tabs on Sundays and 1 tablet rest of the week    2.  Vitamin D deficiency  -This continues to be low, question imperfect adherence  Continue  Ergocalciferol 50,000 IU weekly   Follow-up in 4 months   Signed electronically by: Lyndle Herrlich, MD  [facility]  [facility] [address] [place], [place] [address] Phone: [phone] FAX: [phone]   CC: Patient, [name] No address on file Phone: None Fax: None   Return to Endocrinology clinic as below: Future Appointments  Date Time Provider Department Center  10/03/2023 10:30 AM Rashaan Wyles, Konrad Dolores, MD [facility] None

## 2023-10-04 LAB — BETA HCG QUANT (REF LAB): hCG Quant: <1 m[IU]/mL

## 2023-10-06 ENCOUNTER — Other Ambulatory Visit: Payer: Self-pay

## 2023-10-06 MED ORDER — VITAMIN D (ERGOCALCIFEROL) 1.25 MG (50000 UNIT) PO CAPS
50000.0000 [IU] | ORAL_CAPSULE | ORAL | 3 refills | Status: DC
  Filled 2023-10-06 – 2024-02-12 (×2): qty 12, 84d supply, fill #0

## 2023-10-06 MED ORDER — METHIMAZOLE 5 MG PO TABS
5.0000 mg | ORAL_TABLET | ORAL | 2 refills | Status: DC
  Filled 2023-10-06: qty 108, fill #0
  Filled 2024-02-12: qty 108, 84d supply, fill #0
  Filled 2024-02-12: qty 108, 90d supply, fill #0

## 2023-10-20 ENCOUNTER — Ambulatory Visit (HOSPITAL_COMMUNITY): Payer: Medicaid Other

## 2023-10-23 ENCOUNTER — Ambulatory Visit: Payer: Medicaid Other | Admitting: Obstetrics & Gynecology

## 2023-11-12 ENCOUNTER — Telehealth: Payer: Medicaid Other | Admitting: Family Medicine

## 2023-11-12 DIAGNOSIS — R531 Weakness: Secondary | ICD-10-CM

## 2023-11-12 DIAGNOSIS — M545 Low back pain, unspecified: Secondary | ICD-10-CM

## 2023-11-12 NOTE — Progress Notes (Signed)
Because you are having areas of sensation changes and weakness your condition warrants further evaluation and I recommend that you be seen in a face to face visit at the local urgent care.   NOTE: There will be NO CHARGE for this eVisit   If you are having a true medical emergency please call 911.

## 2023-12-01 ENCOUNTER — Encounter: Payer: Self-pay | Admitting: Obstetrics & Gynecology

## 2023-12-10 ENCOUNTER — Emergency Department (HOSPITAL_COMMUNITY)
Admission: EM | Admit: 2023-12-10 | Discharge: 2023-12-11 | Discharge status: Against Medical Advice | Payer: Medicaid Other | Attending: Emergency Medicine | Admitting: Emergency Medicine

## 2023-12-10 ENCOUNTER — Other Ambulatory Visit: Payer: Self-pay

## 2023-12-10 DIAGNOSIS — J029 Acute pharyngitis, unspecified: Secondary | ICD-10-CM | POA: Insufficient documentation

## 2023-12-10 DIAGNOSIS — R5383 Other fatigue: Secondary | ICD-10-CM | POA: Insufficient documentation

## 2023-12-10 DIAGNOSIS — Z5321 Procedure and treatment not carried out due to patient leaving prior to being seen by health care provider: Secondary | ICD-10-CM | POA: Diagnosis not present

## 2023-12-10 DIAGNOSIS — M7918 Myalgia, other site: Secondary | ICD-10-CM | POA: Diagnosis not present

## 2023-12-10 LAB — GROUP A STREP BY PCR: Group A Strep by PCR: NOT DETECTED

## 2023-12-10 NOTE — ED Triage Notes (Signed)
Patient reports sore throat , dry cough, body aches and fatigue this week .

## 2023-12-11 NOTE — ED Notes (Signed)
 Pt called x3 with no answer, pt taken OTF.

## 2023-12-30 ENCOUNTER — Ambulatory Visit: Payer: Medicaid Other | Admitting: Obstetrics & Gynecology

## 2023-12-30 ENCOUNTER — Other Ambulatory Visit (INDEPENDENT_AMBULATORY_CARE_PROVIDER_SITE_OTHER): Payer: Self-pay

## 2023-12-30 ENCOUNTER — Other Ambulatory Visit (HOSPITAL_COMMUNITY): Payer: Self-pay

## 2023-12-30 ENCOUNTER — Encounter: Payer: Self-pay | Admitting: Orthopaedic Surgery

## 2023-12-30 ENCOUNTER — Ambulatory Visit: Payer: Medicaid Other | Admitting: Orthopaedic Surgery

## 2023-12-30 VITALS — BP 114/77 | HR 108 | Ht 62.0 in | Wt 230.0 lb

## 2023-12-30 DIAGNOSIS — M545 Low back pain, unspecified: Secondary | ICD-10-CM | POA: Diagnosis not present

## 2023-12-30 MED ORDER — IBUPROFEN 800 MG PO TABS
800.0000 mg | ORAL_TABLET | Freq: Three times a day (TID) | ORAL | 0 refills | Status: AC | PRN
  Filled 2023-12-30: qty 6, 2d supply, fill #0

## 2023-12-30 NOTE — Progress Notes (Signed)
 Office Visit Note   Patient: Betty Thompson           Date of Birth: [birth year]           MRN: [id number] Visit Date: 12/30/2023              Requested by: No referring provider defined for this encounter. PCP: Patient, [name]   Assessment & Plan: Visit Diagnoses:  1. Pain of lumbar spine     Plan: Will send in some ibuprofen  and set her up for some physical therapy.  Recheck 8 weeks.  Follow-Up Instructions: Return in about 8 weeks (around 02/24/2024).   Orders:  Orders Placed This Encounter  Procedures   XR Lumbar Spine 2-3 Views   Meds ordered this encounter  Medications   ibuprofen  (ADVIL ) 800 MG tablet    Sig: Take 1 tablet (800 mg total) by mouth every 8 (eight) hours as needed.    Dispense:  6 tablet    Refill:  0      Procedures: No procedures performed   Clinical Data: No additional findings.   Subjective: Chief Complaint  Patient presents with   Lower Back - Pain    HPI 37-year female seen with low back pain acute on chronic with fall on the stairs on 12/16/2023.  She hit her back on the stairs today has had increased pain she likes to cook but states she has trouble lifting pots.  No associated bowel or bladder symptoms.  She is a single mother of 3 children.  Last work 4 months ago.  No fever or chills.  Review of Systems all other systems updated noncontributory to HPI.   Objective: Vital Signs: BP 114/77   Pulse (!) 108   Ht 5' 2 (1.575 m)   Wt 230 lb (104.3 kg)   BMI 42.07 kg/m   Physical Exam Constitutional:      Appearance: She is well-developed.  HENT:     Head: Normocephalic.     Right Ear: External ear normal.     Left Ear: External ear normal. There is no impacted cerumen.  Eyes:     Pupils: Pupils are equal, round, and reactive to light.  Neck:     Thyroid : No thyromegaly.     Trachea: No tracheal deviation.  Cardiovascular:     Rate and Rhythm: Normal rate.  Pulmonary:     Effort: Pulmonary effort is normal.   Abdominal:     Palpations: Abdomen is soft.  Musculoskeletal:     Cervical back: No rigidity.  Skin:    General: Skin is warm and dry.  Neurological:     Mental Status: She is alert and oriented to person, place, and time.  Psychiatric:        Behavior: Behavior normal.     Ortho Exam patient is able to heel and toe walk she has some problems with heel walking with balance.  Negative straight leg raising 90 degrees.  Tenderness right and left at the thoracolumbar junction radiates down to the sacroiliac joints.  Anterior tib gastrocsoleus is strong no rash over exposed skin sensation is intact.  Mild trochanteric tenderness mild sciatic notch tenderness right and left.  Specialty Comments:  No specialty comments available.  Imaging: No results found.   PMFS History: Patient Active Problem List   Diagnosis Date Noted   Vitamin D  deficiency 05/07/2023   Irregular menstruation 05/07/2023   Hyperthyroidism 05/07/2023   History of herpes genitalis 05/28/2022   Menorrhagia  with regular cycle 05/28/2022   Past Medical History:  Diagnosis Date   Anxiety    Depression     Family History  Problem Relation Age of Onset   Heart disease Father    Heart attack Father    Stroke Father     Past Surgical History:  Procedure Laterality Date   CESAREAN SECTION     MOUTH SURGERY     Social History   Occupational History   Not on file  Tobacco Use   Smoking status: Every Day    Current packs/day: 0.50    Types: Cigarettes   Smokeless tobacco: Never   Tobacco comments:    1 pack per week  Vaping Use   Vaping status: Never Used  Substance and Sexual Activity   Alcohol use: No   Drug use: Yes    Frequency: 7.0 times per week    Types: Marijuana   Sexual activity: Yes    Partners: Female    Birth control/protection: None

## 2023-12-30 NOTE — Addendum Note (Signed)
 Addended by: Rogers Seeds on: 12/30/2023 02:45 PM   Modules accepted: Orders

## 2024-01-09 ENCOUNTER — Other Ambulatory Visit (HOSPITAL_COMMUNITY): Payer: Self-pay

## 2024-01-13 ENCOUNTER — Ambulatory Visit: Payer: Medicaid Other | Attending: Orthopaedic Surgery

## 2024-01-14 ENCOUNTER — Ambulatory Visit: Payer: Medicaid Other | Admitting: Obstetrics & Gynecology

## 2024-01-23 ENCOUNTER — Ambulatory Visit: Payer: Medicaid Other | Admitting: Internal Medicine

## 2024-02-03 ENCOUNTER — Ambulatory Visit: Payer: Medicaid Other | Admitting: Internal Medicine

## 2024-02-03 NOTE — Progress Notes (Deleted)
 Name: Betty Thompson  MRN/ DOB: [id number], 01/16/1986    Age/ Sex: 38 y.o., female    PCP: Patient, [name]   Reason for Endocrinology Evaluation: Hyperthyroidism     Date of Initial Endocrinology Evaluation: 05/06/2023    HPI: Betty Thompson is a 38 y.o. female with a past medical history of Hyperthyroidism. The patient presented for initial endocrinology clinic visit on 05/06/2023  for consultative assistance with her Hyperthyroidism.   Patient has been diagnosed with hyperthyroidism in 07/2022 with suppressed TSH <0.005 u IU/mL, elevated free T4 at 2.29 NG/DL  Patient has been following up with GYN for menorrhagia   Father with multiple health issues, unclear thyroid  issues  She was started on methimazole  04/2023 with a suppressed TSH and elevated T3   She was started on vitamin D  04/2023 with a vitamin D  level at 7.96 NG/mL  Thyroid  ultrasound 05/2023 did not reveal any thyroid  nodules   SUBJECTIVE:    Today (02/03/24):  Betty Thompson is here for a follow up on hyperthyroidism.   Weight has been fluctuating  Has been out of Methimazole  for 3 days  Denies local neck swelling  Denies palpitations  Has rare tremors  Denies constipation , but has diarrhea  LMP has been regular 09/26/2023, noted spotting 10/3rd  Has noted vomiting and metallic taste and fatigue, suspects pregnancy  Patient follows with GYN  Methimazole  5 mg, 2 tabs on Saturdays 2 tabs on Sundays and 1 tablet rest of the week  Ergocalciferol  50,000 weekly   HISTORY:  Past Medical History:  Past Medical History:  Diagnosis Date   Anxiety    Depression    Past Surgical History:  Past Surgical History:  Procedure Laterality Date   CESAREAN SECTION     MOUTH SURGERY      Social History:  reports that she has been smoking cigarettes. She has never used smokeless tobacco. She reports current drug use. Frequency: 7.00 times per week. Drug: Marijuana. She reports that she does not drink  alcohol. Family History: family history includes Heart attack in her father; Heart disease in her father; Stroke in her father.   HOME MEDICATIONS: Allergies as of 02/03/2024       Reactions   Benadryl [diphenhydramine] Swelling        Medication List        Accurate as of February 03, 2024  7:04 AM. If you have any questions, ask your nurse or doctor.          dicyclomine  20 MG tablet Commonly known as: BENTYL  Take 1 tablet (20 mg total) by mouth 4 (four) times daily as needed (intestinal cramps).   ibuprofen  800 MG tablet Commonly known as: ADVIL  Take 1 tablet (800 mg total) by mouth every 8 (eight) hours as needed.   methimazole  5 MG tablet Commonly known as: TAPAZOLE  Take 1 tablet (5 mg total) by mouth as directed. 2 tabs on Saturdays and Sundays and 1 tab rest of the week   valACYclovir  500 MG tablet Commonly known as: Valtrex  Take 1 tablet (500 mg total) by mouth 2 (two) times daily for 5 days for herpes outbreak   Vitamin D  (Ergocalciferol ) 1.25 MG (50000 UNIT) Caps capsule Commonly known as: DRISDOL  Take 1 capsule (50,000 Units total) by mouth every 7 (seven) days.          REVIEW OF SYSTEMS: A comprehensive ROS was conducted with the patient and is negative except as per HPI  OBJECTIVE:  VS: There were no vitals taken for this visit.   Wt Readings from Last 3 Encounters:  12/30/23 230 lb (104.3 kg)  10/03/23 220 lb (99.8 kg)  05/28/23 206 lb 5.6 oz (93.6 kg)     EXAM: General: Pt is tearful  Eyes: External eye exam normal without stare, lid lag or exophthalmos.  EOM intact.   Neck: General: Supple without adenopathy. Thyroid : Thyroid  size normal.  No goiter or nodules appreciated. No thyroid  bruit.  Lungs: Clear with good BS bilat   Heart: Auscultation: RRR.  Abdomen: Soft, nontender  Extremities:  BL LE: No pretibial edema   Mental Status: Judgment, insight: Intact Orientation: Oriented to time, place, and person Mood and affect: No  depression, anxiety, or agitation     DATA REVIEWED:   Latest Reference Range & Units 10/03/23 10:36  VITD 30.00 - 100.00 ng/mL 11.02 (L)  (L): Data is abnormally low   Latest Reference Range & Units 10/03/23 10:36  hCG Quant mIU/mL <1  TSH 0.35 - 5.50 uIU/mL 0.02 (L)  T4,Free(Direct) 0.60 - 1.60 ng/dL 9.08  (L): Data is abnormally low    Thyroid  Ultrasound 06/03/2023   Estimated total number of nodules >/= 1 cm: 0   Number of spongiform nodules >/=  2 cm not described below (TR1): 0   Number of mixed cystic and solid nodules >/= 1.5 cm not described below (TR2): 0   _________________________________________________________   No discrete nodules are seen within the thyroid  gland. Vascularity of the thyroid  parenchyma is subjectively normal. There are some mildly prominent cervical lymph nodes noted bilaterally demonstrating normal lymph node architecture. Although each has short axis dimensions of approximately 1 cm, both right and left cervical lymph nodes are fairly elongated. These most likely represent benign or reactive lymph nodes. Correlation suggested with any underlying inflammatory disease or evidence of lymphoproliferative disease.   IMPRESSION: 1. Mildly enlarged and heterogeneous thyroid  gland without focal nodules identified. 2. Mildly prominent bilateral cervical lymph nodes demonstrating normal lymph node architecture. Correlation suggested with any underlying inflammatory disease or evidence of lymphoproliferative disease.        ASSESSMENT/PLAN/RECOMMENDATIONS:   Hyperthyroidism:  -TSH remains low, I am concerned about imperfect adherence -Patient advised to obtain a pillbox -I will increase methimazole  as below -Patient has symptoms compatible pregnancy, hCG negative    Medications : Increase methimazole  5 mg, 2 tabs on Saturdays 2 tabs on Sundays and 1 tablet rest of the week    2.  Vitamin D  deficiency  -This continues to be  low, question imperfect adherence  Continue  Ergocalciferol  50,000 IU weekly   Follow-up in 4 months   Signed electronically by: Stefano Redgie Butts, MD  [facility]  [facility] [address] [place], [place] [address] Phone: [phone] FAX: [phone]   CC: Patient, [name] No address on file Phone: None Fax: None   Return to Endocrinology clinic as below: Future Appointments  Date Time Provider Department Center  02/03/2024  2:20 PM Axle Parfait, Donell Redgie, MD [facility] None

## 2024-02-12 ENCOUNTER — Other Ambulatory Visit: Payer: Self-pay

## 2024-02-17 ENCOUNTER — Other Ambulatory Visit: Payer: Self-pay

## 2024-04-10 ENCOUNTER — Ambulatory Visit (HOSPITAL_COMMUNITY)
Admission: EM | Admit: 2024-04-10 | Discharge: 2024-04-10 | Disposition: A | Discharge status: Home / Self Care | Attending: Family Medicine | Admitting: Family Medicine

## 2024-04-10 ENCOUNTER — Encounter (HOSPITAL_COMMUNITY): Payer: Self-pay | Admitting: Emergency Medicine

## 2024-04-10 DIAGNOSIS — Z3202 Encounter for pregnancy test, result negative: Secondary | ICD-10-CM | POA: Diagnosis not present

## 2024-04-10 DIAGNOSIS — R5383 Other fatigue: Secondary | ICD-10-CM | POA: Diagnosis present

## 2024-04-10 LAB — CBC
HCT: 42.3 % (ref 36.0–46.0)
Hemoglobin: 14 g/dL (ref 12.0–15.0)
MCH: 28.7 pg (ref 26.0–34.0)
MCHC: 33.1 g/dL (ref 30.0–36.0)
MCV: 86.7 fL (ref 80.0–100.0)
Platelets: 326 10*3/uL (ref 150–400)
RBC: 4.88 MIL/uL (ref 3.87–5.11)
RDW: 13.3 % (ref 11.5–15.5)
WBC: 6.5 10*3/uL (ref 4.0–10.5)
nRBC: 0 % (ref 0.0–0.2)

## 2024-04-10 LAB — BASIC METABOLIC PANEL WITH GFR
Anion gap: 11 (ref 5–15)
BUN: 8 mg/dL (ref 6–20)
CO2: 22 mmol/L (ref 22–32)
Calcium: 9 mg/dL (ref 8.9–10.3)
Chloride: 105 mmol/L (ref 98–111)
Creatinine, Ser: 1.07 mg/dL — ABNORMAL HIGH (ref 0.44–1.00)
GFR, Estimated: >60 mL/min (ref >60)
Glucose, Bld: 91 mg/dL (ref 70–99)
Potassium: 3.7 mmol/L (ref 3.5–5.1)
Sodium: 138 mmol/L (ref 135–145)

## 2024-04-10 LAB — HCG, QUANTITATIVE, PREGNANCY: hCG, Beta Chain, Quant, S: <1 m[IU]/mL (ref <5)

## 2024-04-10 LAB — POCT URINE PREGNANCY: Preg Test, Ur: NEGATIVE

## 2024-04-10 LAB — HIV ANTIBODY (ROUTINE TESTING W REFLEX): HIV Screen 4th Generation wRfx: NONREACTIVE

## 2024-04-10 NOTE — Discharge Instructions (Addendum)
 Fatigued but without other symptoms consistent with influenza or COVID.  Symptoms have also been going on for more than 3 to 4 days now.  Given your concern for pregnancy, we will send off a blood test as the urine test is negative.  We will also draw blood for HIV and syphilis but these results will take 24 to 48 hours.  These will be on your MyChart if everything is negative.  We will contact you if anything is positive.  Given the significant fatigue we will also order a complete blood count and basic metabolic panel.  This will check for anemia, infection, electrolyte disorder or kidney disorder.  These results will be available in 24 to 48 hours and we will contact you with any abnormalities that would require further workup or treatment.

## 2024-04-10 NOTE — ED Provider Notes (Signed)
 [facility]    CSN: [id number] Arrival date & time: 04/10/24  1712      History   Chief Complaint Chief Complaint  Patient presents with   Fatigue   Possible Pregnancy    HPI Betty Thompson is a 38 y.o. female.   38 year old female who presents to urgent care with 4 to 5 days of feeling fatigued.  She reports that she has been sleeping more and just in general feels exhausted.  She does have some congestion but reports that this time of year she stays congested.  She denies fevers, chills, cough, shortness of breath, nausea, vomiting, abdominal pain.  She does relate that when she has had this symptom in the past it has been when she is pregnant.  Her last menses was 3/29.  She has had intercourse since then 3 times.    Possible Pregnancy Pertinent negatives include no chest pain, no abdominal pain and no shortness of breath.    Past Medical History:  Diagnosis Date   Anxiety    Depression     Patient Active Problem List   Diagnosis Date Noted   Vitamin D deficiency 05/07/2023   Irregular menstruation 05/07/2023   Hyperthyroidism 05/07/2023   History of herpes genitalis 05/28/2022   Menorrhagia with regular cycle 05/28/2022    Past Surgical History:  Procedure Laterality Date   CESAREAN SECTION     MOUTH SURGERY      OB History     Gravida  3   Para  3   Term  3   Preterm      AB      Living  3      SAB      IAB      Ectopic      Multiple      Live Births  3            Home Medications    Prior to Admission medications   Medication Sig Start Date End Date Taking? Authorizing Provider  dicyclomine (BENTYL) 20 MG tablet Take 1 tablet (20 mg total) by mouth 4 (four) times daily as needed (intestinal cramps). 01/08/23   Banister, Pamela K, MD  ibuprofen (ADVIL) 800 MG tablet Take 1 tablet (800 mg total) by mouth every 8 (eight) hours as needed. 12/30/23   Adah Acron, MD  methimazole (TAPAZOLE) 5 MG tablet Take 2 tablets on  Saturdays and Sundays and 1 tab rest of the week 10/06/23   Shamleffer, Ibtehal Jaralla, MD  valACYclovir (VALTREX) 500 MG tablet Take 1 tablet (500 mg total) by mouth 2 (two) times daily for 5 days for herpes outbreak 12/20/22   Constant, Peggy, MD  Vitamin D, Ergocalciferol, (DRISDOL) 1.25 MG (50000 UNIT) CAPS capsule Take 1 capsule (50,000 Units total) by mouth every 7 (seven) days. 10/06/23   Shamleffer, Ibtehal Jaralla, MD  cetirizine (ZYRTEC ALLERGY) 10 MG tablet Take 1 tablet (10 mg total) by mouth daily. 08/30/20 02/16/21  Adolph Hoop, PA-C    Family History Family History  Problem Relation Age of Onset   Heart disease Father    Heart attack Father    Stroke Father     Social History Social History   Tobacco Use   Smoking status: Every Day    Current packs/day: 0.50    Types: Cigarettes   Smokeless tobacco: Never   Tobacco comments:    1 pack per week  Vaping Use   Vaping status: Never Used  Substance Use  Topics   Alcohol use: No   Drug use: Yes    Frequency: 7.0 times per week    Types: Marijuana     Allergies   Benadryl [diphenhydramine]   Review of Systems Review of Systems  Constitutional:  Positive for fatigue. Negative for chills and fever.  HENT:  Negative for ear pain and sore throat.   Eyes:  Negative for pain and visual disturbance.  Respiratory:  Negative for cough and shortness of breath.   Cardiovascular:  Negative for chest pain and palpitations.  Gastrointestinal:  Negative for abdominal pain and vomiting.  Genitourinary:  Negative for dysuria and hematuria.  Musculoskeletal:  Negative for arthralgias and back pain.  Skin:  Negative for color change and rash.  Neurological:  Negative for seizures and syncope.  All other systems reviewed and are negative.    Physical Exam Triage Vital Signs ED Triage Vitals  Encounter Vitals Group     BP 04/10/24 1740 124/89     Systolic BP Percentile --      Diastolic BP Percentile --      Pulse Rate  04/10/24 1740 81     Resp 04/10/24 1740 18     Temp 04/10/24 1740 98.2 F (36.8 C)     Temp src --      SpO2 04/10/24 1740 97 %     Weight --      Height --      Head Circumference --      Peak Flow --      Pain Score 04/10/24 1738 0     Pain Loc --      Pain Education --      Exclude from Growth Chart --    No data found.  Updated Vital Signs BP 124/89 (BP Location: Left Arm)   Pulse 81   Temp 98.2 F (36.8 C)   Resp 18   LMP 03/27/2024 (Exact Date)   SpO2 97%   Visual Acuity Right Eye Distance:   Left Eye Distance:   Bilateral Distance:    Right Eye Near:   Left Eye Near:    Bilateral Near:     Physical Exam Vitals and nursing note reviewed.  Constitutional:      General: She is not in acute distress.    Appearance: She is well-developed.  HENT:     Head: Normocephalic and atraumatic.  Eyes:     Conjunctiva/sclera: Conjunctivae normal.  Cardiovascular:     Rate and Rhythm: Normal rate and regular rhythm.     Heart sounds: No murmur heard. Pulmonary:     Effort: Pulmonary effort is normal. No respiratory distress.     Breath sounds: Normal breath sounds.  Abdominal:     Palpations: Abdomen is soft.     Tenderness: There is no abdominal tenderness.  Musculoskeletal:        General: No swelling.     Cervical back: Neck supple.  Skin:    General: Skin is warm and dry.     Capillary Refill: Capillary refill takes less than 2 seconds.  Neurological:     Mental Status: She is alert.  Psychiatric:        Mood and Affect: Mood normal.      UC Treatments / Results  Labs (all labs ordered are listed, but only abnormal results are displayed) Labs Reviewed  POCT URINE PREGNANCY    EKG   Radiology No results found.  Procedures Procedures (including critical care time)  Medications Ordered  in UC Medications - No data to display  Initial Impression / Assessment and Plan / UC Course  I have reviewed the triage vital signs and the nursing  notes.  Pertinent labs & imaging results that were available during my care of the patient were reviewed by me and considered in my medical decision making (see chart for details).     No diagnosis found.   Fatigued but without other symptoms consistent with influenza or COVID.  Symptoms have also been going on for more than 3 to 4 days now.  Given your concern for pregnancy, we will send off a blood test as the urine test is negative.  We will also draw blood for HIV and syphilis but these results will take 24 to 48 hours.  These will be on your MyChart if everything is negative.  We will contact you if anything is positive.  Given the significant fatigue we will also order a complete blood count and basic metabolic panel.  This will check for anemia, infection, electrolyte disorder or kidney disorder.  These results will be available in 24 to 48 hours and we will contact you with any abnormalities that would require further workup or treatment.  Final Clinical Impressions(s) / UC Diagnoses   Final diagnoses:  None   Discharge Instructions   None    ED Prescriptions   None    PDMP not reviewed this encounter.   Kreg Pesa, [place] 04/10/24 1811

## 2024-04-10 NOTE — ED Triage Notes (Addendum)
 Pt has multiple complaints.  Reports flu symptoms started 5 days ago--fatigue and tired a lot.  Believes could be pregnant. LMP 3/29. Had negative home pregnancy test at home but had these symptoms with one of her pregnancies before.

## 2024-04-11 LAB — RPR: RPR Ser Ql: NONREACTIVE

## 2024-05-05 ENCOUNTER — Encounter: Payer: Self-pay | Admitting: Internal Medicine

## 2024-05-19 ENCOUNTER — Ambulatory Visit: Admitting: Internal Medicine

## 2024-05-23 ENCOUNTER — Ambulatory Visit (HOSPITAL_COMMUNITY)
Admission: EM | Admit: 2024-05-23 | Discharge: 2024-05-23 | Disposition: A | Discharge status: Home / Self Care | Attending: Nurse Practitioner | Admitting: Nurse Practitioner

## 2024-05-23 ENCOUNTER — Encounter (HOSPITAL_COMMUNITY): Payer: Self-pay | Admitting: Emergency Medicine

## 2024-05-23 DIAGNOSIS — Z3202 Encounter for pregnancy test, result negative: Secondary | ICD-10-CM | POA: Insufficient documentation

## 2024-05-23 DIAGNOSIS — N898 Other specified noninflammatory disorders of vagina: Secondary | ICD-10-CM | POA: Diagnosis present

## 2024-05-23 LAB — POCT URINE PREGNANCY: Preg Test, Ur: NEGATIVE

## 2024-05-23 LAB — HIV ANTIBODY (ROUTINE TESTING W REFLEX): HIV Screen 4th Generation wRfx: NONREACTIVE

## 2024-05-23 NOTE — ED Triage Notes (Signed)
 Pt reports having vaginal discharge and underwear is constantly wet. Reports her LMP was only 3 days and normally 7-8 days, unsure if pregnant. Lmp 5/14

## 2024-05-23 NOTE — ED Provider Notes (Signed)
 [facility]    CSN: [id number] Arrival date & time: 05/23/24  1006      History   Chief Complaint Chief Complaint  Patient presents with   Vaginal Discharge    HPI Betty Thompson is a 38 y.o. female.   Patient presents today requesting STI testing.  She reports clear vaginal discharge for the past few days.  No vaginal odor, vaginal pain, rashes, sores, or lesions.  Reports last menstrual cycle was shorter and lighter than normal, requesting pregnancy testing today.  No known exposures to STI.  No abdominal pain, pelvic pain, nausea, vomiting, fever, groin swelling.  Medical history of trichomonas and herpes genitalis.    Past Medical History:  Diagnosis Date   Anxiety    Depression     Patient Active Problem List   Diagnosis Date Noted   Vitamin D  deficiency 05/07/2023   Irregular menstruation 05/07/2023   Hyperthyroidism 05/07/2023   History of herpes genitalis 05/28/2022   Menorrhagia with regular cycle 05/28/2022    Past Surgical History:  Procedure Laterality Date   CESAREAN SECTION     MOUTH SURGERY      OB History     Gravida  3   Para  3   Term  3   Preterm      AB      Living  3      SAB      IAB      Ectopic      Multiple      Live Births  3            Home Medications    Prior to Admission medications   Medication Sig Start Date End Date Taking? Authorizing Provider  dicyclomine  (BENTYL ) 20 MG tablet Take 1 tablet (20 mg total) by mouth 4 (four) times daily as needed (intestinal cramps). 01/08/23   Ann Keto, MD  ibuprofen  (ADVIL ) 800 MG tablet Take 1 tablet (800 mg total) by mouth every 8 (eight) hours as needed. 12/30/23   Adah Acron, MD  methimazole  (TAPAZOLE ) 5 MG tablet Take 2 tablets on Saturdays and Sundays and 1 tab rest of the week 10/06/23   Shamleffer, Ibtehal Jaralla, MD  valACYclovir  (VALTREX ) 500 MG tablet Take 1 tablet (500 mg total) by mouth 2 (two) times daily for 5 days for herpes  outbreak 12/20/22   Constant, Peggy, MD  Vitamin D , Ergocalciferol , (DRISDOL ) 1.25 MG (50000 UNIT) CAPS capsule Take 1 capsule (50,000 Units total) by mouth every 7 (seven) days. 10/06/23   Shamleffer, Ibtehal Jaralla, MD  cetirizine  (ZYRTEC  ALLERGY) 10 MG tablet Take 1 tablet (10 mg total) by mouth daily. 08/30/20 02/16/21  Adolph Hoop, PA-C    Family History Family History  Problem Relation Age of Onset   Heart disease Father    Heart attack Father    Stroke Father     Social History Social History   Tobacco Use   Smoking status: Every Day    Current packs/day: 0.50    Types: Cigarettes   Smokeless tobacco: Never   Tobacco comments:    1 pack per week  Vaping Use   Vaping status: Never Used  Substance Use Topics   Alcohol use: No   Drug use: Yes    Frequency: 7.0 times per week    Types: Marijuana     Allergies   Benadryl [diphenhydramine]   Review of Systems Review of Systems Per HPI  Physical Exam Triage Vital Signs ED  Triage Vitals  Encounter Vitals Group     BP 05/23/24 1025 111/74     Systolic BP Percentile --      Diastolic BP Percentile --      Pulse Rate 05/23/24 1025 78     Resp 05/23/24 1025 17     Temp 05/23/24 1025 98 F (36.7 C)     Temp Source 05/23/24 1025 Oral     SpO2 05/23/24 1025 97 %     Weight --      Height --      Head Circumference --      Peak Flow --      Pain Score 05/23/24 1024 0     Pain Loc --      Pain Education --      Exclude from Growth Chart --    No data found.  Updated Vital Signs BP 111/74 (BP Location: Left Arm)   Pulse 78   Temp 98 F (36.7 C) (Oral)   Resp 17   LMP 05/12/2024 (Exact Date)   SpO2 97%   Visual Acuity Right Eye Distance:   Left Eye Distance:   Bilateral Distance:    Right Eye Near:   Left Eye Near:    Bilateral Near:     Physical Exam Vitals and nursing note reviewed.  Constitutional:      General: She is not in acute distress.    Appearance: Normal appearance. She is not  toxic-appearing.  Pulmonary:     Effort: Pulmonary effort is normal. No respiratory distress.  Genitourinary:    Comments: Deferred - self swab performed by patient Skin:    General: Skin is warm and dry.     Coloration: Skin is not jaundiced or pale.     Findings: No erythema.  Neurological:     Mental Status: She is alert and oriented to person, place, and time.     Motor: No weakness.     Gait: Gait normal.  Psychiatric:        Mood and Affect: Mood normal.        Behavior: Behavior is cooperative.      UC Treatments / Results  Labs (all labs ordered are listed, but only abnormal results are displayed) Labs Reviewed  POCT URINE PREGNANCY - Normal  HIV ANTIBODY (ROUTINE TESTING W REFLEX)  RPR  CERVICOVAGINAL ANCILLARY ONLY    EKG   Radiology No results found.  Procedures Procedures (including critical care time)  Medications Ordered in UC Medications - No data to display  Initial Impression / Assessment and Plan / UC Course  I have reviewed the triage vital signs and the nursing notes.  Pertinent labs & imaging results that were available during my care of the patient were reviewed by me and considered in my medical decision making (see chart for details).   Patient is well-appearing, normotensive, afebrile, not tachycardic, not tachypneic, oxygenating well on room air.   1. Vaginal discharge 2. Urine pregnancy test negative Cytology is pending-treat as indicated HIV and RPR also pending Safe sex practices discussed with patient  The patient was given the opportunity to ask questions.  All questions answered to their satisfaction.  The patient is in agreement to this plan.   Final Clinical Impressions(s) / UC Diagnoses   Final diagnoses:  Vaginal discharge  Urine pregnancy test negative     Discharge Instructions      We will contact you with any positive results from today.  Recommend condom use  with every sexual counter going forward to prevent  STI.  Avoid sex until you are aware of the results.  ED Prescriptions   None    PDMP not reviewed this encounter.   Wilhemena Harbour, NP 05/23/24 1100

## 2024-05-23 NOTE — Discharge Instructions (Addendum)
 We will contact you with any positive results from today.  Recommend condom use with every sexual counter going forward to prevent STI.  Avoid sex until you are aware of the results.

## 2024-05-24 LAB — RPR: RPR Ser Ql: NONREACTIVE

## 2024-05-25 LAB — CERVICOVAGINAL ANCILLARY ONLY
Bacterial Vaginitis (gardnerella): POSITIVE — AB
Candida Glabrata: NEGATIVE
Candida Vaginitis: NEGATIVE
Chlamydia: NEGATIVE
Comment: NEGATIVE
Comment: NEGATIVE
Comment: NEGATIVE
Comment: NEGATIVE
Comment: NEGATIVE
Comment: NORMAL
Neisseria Gonorrhea: NEGATIVE
Trichomonas: POSITIVE — AB

## 2024-05-26 ENCOUNTER — Encounter: Payer: Self-pay | Admitting: Internal Medicine

## 2024-05-26 ENCOUNTER — Ambulatory Visit (HOSPITAL_COMMUNITY): Payer: Self-pay

## 2024-05-26 ENCOUNTER — Other Ambulatory Visit: Payer: Self-pay

## 2024-05-26 ENCOUNTER — Ambulatory Visit (INDEPENDENT_AMBULATORY_CARE_PROVIDER_SITE_OTHER): Admitting: Internal Medicine

## 2024-05-26 VITALS — BP 124/78 | HR 88 | Ht 62.0 in | Wt 224.0 lb

## 2024-05-26 DIAGNOSIS — E559 Vitamin D deficiency, unspecified: Secondary | ICD-10-CM | POA: Diagnosis not present

## 2024-05-26 DIAGNOSIS — R590 Localized enlarged lymph nodes: Secondary | ICD-10-CM | POA: Diagnosis not present

## 2024-05-26 DIAGNOSIS — E0590 Thyrotoxicosis, unspecified without thyrotoxic crisis or storm: Secondary | ICD-10-CM | POA: Diagnosis not present

## 2024-05-26 MED ORDER — METRONIDAZOLE 500 MG PO TABS
500.0000 mg | ORAL_TABLET | Freq: Two times a day (BID) | ORAL | 0 refills | Status: AC
  Filled 2024-05-26: qty 14, 7d supply, fill #0

## 2024-05-26 NOTE — Progress Notes (Unsigned)
 Name: Betty Thompson  MRN/ DOB: [id number], September 04, 1986    Age/ Sex: 38 y.o., female    PCP: Patient, [name]   Reason for Endocrinology Evaluation: Hyperthyroidism     Date of Initial Endocrinology Evaluation: 05/06/2023    HPI: Betty Thompson is a 38 y.o. female with a past medical history of Hyperthyroidism. The patient presented for initial endocrinology clinic visit on 05/06/2023  for consultative assistance with her Hyperthyroidism.   Patient has been diagnosed with hyperthyroidism in 07/2022 with suppressed TSH <0.005 u IU/mL, elevated free T4 at 2.29 NG/DL  Patient has been following up with GYN for menorrhagia   Father with multiple health issues, unclear thyroid  issues  She was started on methimazole  04/2023 with a suppressed TSH and elevated T3   She was started on vitamin D  04/2023 with a vitamin D  level at 7.96 NG/mL  Thyroid  ultrasound 05/2023 did not reveal any thyroid  nodules   SUBJECTIVE:    Today (05/26/24):  Betty Thompson is here for a follow up on hyperthyroidism.   Weight has trended down   Denies local neck swelling  Has rare  palpitations  Mild tremors  Has alternating constipation and diarrhea  Patient follows with GYN  Methimazole  5 mg, 2 tabs Saturday and Sundays and 1 tab rest of the week Ergocalciferol  50,000 weekly     HISTORY:  Past Medical History:  Past Medical History:  Diagnosis Date   Anxiety    Depression    Past Surgical History:  Past Surgical History:  Procedure Laterality Date   CESAREAN SECTION     MOUTH SURGERY      Social History:  reports that she has been smoking cigarettes. She has never used smokeless tobacco. She reports current drug use. Frequency: 7.00 times per week. Drug: Marijuana. She reports that she does not drink alcohol. Family History: family history includes Heart attack in her father; Heart disease in her father; Stroke in her father.   HOME MEDICATIONS: Allergies as of 05/26/2024        Reactions   Benadryl [diphenhydramine] Swelling        Medication List        Accurate as of May 26, 2024  1:44 PM. If you have any questions, ask your nurse or doctor.          dicyclomine  20 MG tablet Commonly known as: BENTYL  Take 1 tablet (20 mg total) by mouth 4 (four) times daily as needed (intestinal cramps).   ibuprofen  800 MG tablet Commonly known as: ADVIL  Take 1 tablet (800 mg total) by mouth every 8 (eight) hours as needed.   methimazole  5 MG tablet Commonly known as: TAPAZOLE  Take 2 tablets on Saturdays and Sundays and 1 tab rest of the week   metroNIDAZOLE  500 MG tablet Commonly known as: FLAGYL  Take 1 tablet (500 mg total) by mouth 2 (two) times daily for 7 days. Started by: Nurse Lynnette Saucer   valACYclovir  500 MG tablet Commonly known as: Valtrex  Take 1 tablet (500 mg total) by mouth 2 (two) times daily for 5 days for herpes outbreak   Vitamin D  (Ergocalciferol ) 1.25 MG (50000 UNIT) Caps capsule Commonly known as: DRISDOL  Take 1 capsule (50,000 Units total) by mouth every 7 (seven) days.          REVIEW OF SYSTEMS: A comprehensive ROS was conducted with the patient and is negative except as per HPI     OBJECTIVE:  VS: BP 124/78 (BP Location: Left Arm,  Patient Position: Sitting, Cuff Size: Normal)   Pulse 88   Ht 5\' 2"  (1.575 m)   Wt 224 lb (101.6 kg)   LMP 05/12/2024 (Exact Date)   BMI 40.97 kg/m    Wt Readings from Last 3 Encounters:  05/26/24 224 lb (101.6 kg)  12/30/23 230 lb (104.3 kg)  10/03/23 220 lb (99.8 kg)     EXAM: General: Pt is tearful  Neck: General: Supple without adenopathy. Thyroid : Thyroid  size normal.  No goiter or nodules appreciated. No thyroid  bruit.  Lungs: Clear with good BS bilat   Heart: Auscultation: RRR.  Abdomen: Soft, nontender  Extremities:  BL LE: No pretibial edema   Mental Status: Judgment, insight: Intact Orientation: Oriented to time, place, and person Mood and affect: No depression,  anxiety, or agitation     DATA REVIEWED:  Thyroid  Ultrasound 06/03/2023   Estimated total number of nodules >/= 1 cm: 0   Number of spongiform nodules >/=  2 cm not described below (TR1): 0   Number of mixed cystic and solid nodules >/= 1.5 cm not described below (TR2): 0   _________________________________________________________   No discrete nodules are seen within the thyroid  gland. Vascularity of the thyroid  parenchyma is subjectively normal. There are some mildly prominent cervical lymph nodes noted bilaterally demonstrating normal lymph node architecture. Although each has short axis dimensions of approximately 1 cm, both right and left cervical lymph nodes are fairly elongated. These most likely represent benign or reactive lymph nodes. Correlation suggested with any underlying inflammatory disease or evidence of lymphoproliferative disease.   IMPRESSION: 1. Mildly enlarged and heterogeneous thyroid  gland without focal nodules identified. 2. Mildly prominent bilateral cervical lymph nodes demonstrating normal lymph node architecture. Correlation suggested with any underlying inflammatory disease or evidence of lymphoproliferative disease.        ASSESSMENT/PLAN/RECOMMENDATIONS:   Hyperthyroidism:  -TSH remains low, I am concerned about imperfect adherence -Patient advised to obtain a pillbox -I will increase methimazole  as below -Patient has symptoms compatible pregnancy, hCG negative    Medications : Increase methimazole  5 mg, 2 tabs on Saturdays 2 tabs on Sundays and 1 tablet rest of the week    2.  Vitamin D  deficiency  -This continues to be low, question imperfect adherence  Continue  Ergocalciferol  50,000 IU weekly   Follow-up in 4 months   Signed electronically by: Natale Bail, MD  [facility] End[facility]in[facility]  [facility] [address] [place] [place], [place] [address] Phone: [phone] FAX:  [phone]   CC: Patient, [name] No address on file Phone: None Fax: None   Return to Endocrinology clinic as below: No future appointments.

## 2024-05-27 ENCOUNTER — Ambulatory Visit: Payer: Self-pay | Admitting: Internal Medicine

## 2024-05-27 ENCOUNTER — Other Ambulatory Visit: Payer: Self-pay

## 2024-05-27 LAB — T3, FREE: T3, Free: 3.3 pg/mL (ref 2.3–4.2)

## 2024-05-27 LAB — TSH: TSH: 1.42 m[IU]/L

## 2024-05-27 LAB — T4, FREE: Free T4: 1.1 ng/dL (ref 0.8–1.8)

## 2024-05-27 LAB — VITAMIN D 25 HYDROXY (VIT D DEFICIENCY, FRACTURES): Vit D, 25-Hydroxy: 28 ng/mL — ABNORMAL LOW (ref 30–100)

## 2024-05-27 MED ORDER — METHIMAZOLE 5 MG PO TABS
ORAL_TABLET | ORAL | 2 refills | Status: DC
  Filled 2024-05-27: qty 108, 84d supply, fill #0
  Filled 2024-06-08: qty 108, 85d supply, fill #0

## 2024-05-27 MED ORDER — VITAMIN D (ERGOCALCIFEROL) 1.25 MG (50000 UNIT) PO CAPS
50000.0000 [IU] | ORAL_CAPSULE | ORAL | 3 refills | Status: AC
  Filled 2024-05-27 – 2024-06-08 (×2): qty 12, 84d supply, fill #0

## 2024-05-31 ENCOUNTER — Ambulatory Visit: Admitting: Obstetrics & Gynecology

## 2024-06-07 ENCOUNTER — Other Ambulatory Visit: Payer: Self-pay

## 2024-06-08 ENCOUNTER — Other Ambulatory Visit: Payer: Self-pay

## 2024-06-23 ENCOUNTER — Telehealth: Payer: Self-pay | Admitting: Primary Care

## 2024-06-23 NOTE — Telephone Encounter (Signed)
 Per care everywhere, pt moved to [place].     Removing PCP

## 2024-06-29 ENCOUNTER — Other Ambulatory Visit: Payer: Self-pay

## 2024-06-29 ENCOUNTER — Ambulatory Visit (HOSPITAL_COMMUNITY)
Admission: EM | Admit: 2024-06-29 | Discharge: 2024-06-29 | Disposition: A | Discharge status: Home / Self Care | Attending: Emergency Medicine | Admitting: Emergency Medicine

## 2024-06-29 ENCOUNTER — Encounter (HOSPITAL_COMMUNITY): Payer: Self-pay

## 2024-06-29 DIAGNOSIS — J069 Acute upper respiratory infection, unspecified: Secondary | ICD-10-CM

## 2024-06-29 DIAGNOSIS — Z3202 Encounter for pregnancy test, result negative: Secondary | ICD-10-CM

## 2024-06-29 DIAGNOSIS — R5383 Other fatigue: Secondary | ICD-10-CM

## 2024-06-29 DIAGNOSIS — R062 Wheezing: Secondary | ICD-10-CM

## 2024-06-29 LAB — POCT URINALYSIS DIP (MANUAL ENTRY)
Bilirubin, UA: NEGATIVE
Glucose, UA: NEGATIVE mg/dL
Ketones, POC UA: NEGATIVE mg/dL
Leukocytes, UA: NEGATIVE
Nitrite, UA: NEGATIVE
Protein Ur, POC: NEGATIVE mg/dL
Spec Grav, UA: 1.02 (ref 1.010–1.025)
Urobilinogen, UA: 0.2 U/dL
pH, UA: 5.5 (ref 5.0–8.0)

## 2024-06-29 LAB — POCT URINE PREGNANCY: Preg Test, Ur: NEGATIVE

## 2024-06-29 MED ORDER — ALBUTEROL SULFATE HFA 108 (90 BASE) MCG/ACT IN AERS
1.0000 | INHALATION_SPRAY | Freq: Four times a day (QID) | RESPIRATORY_TRACT | 0 refills | Status: AC | PRN
  Filled 2024-06-29: qty 18, 25d supply, fill #0

## 2024-06-29 MED ORDER — PREDNISONE 10 MG PO TABS
ORAL_TABLET | ORAL | 0 refills | Status: AC
  Filled 2024-06-29: qty 42, 12d supply, fill #0

## 2024-06-29 MED ORDER — AZITHROMYCIN 250 MG PO TABS
250.0000 mg | ORAL_TABLET | Freq: Every day | ORAL | 0 refills | Status: DC
  Filled 2024-06-29: qty 6, 5d supply, fill #0

## 2024-06-29 NOTE — ED Triage Notes (Signed)
 Patient presents to the office fatigue,cough and nasal congestion x 1 day.

## 2024-06-29 NOTE — ED Provider Notes (Signed)
 [facility]    CSN: [id number] Arrival date & time: 06/29/24  1121      History   Chief Complaint Chief Complaint  Patient presents with   Cough   Fatigue   Nasal Congestion    HPI Betty Thompson is a 38 y.o. female.   Patient presents today with cough congestion wheezing nasal since yesterday.  Patient also states that she potentially could be pregnant and would like to have a urinary test completed.  Denies any urinary discharge but does have some frequency.  Patient has not taken anything prior to arrival.  Denies any chest pain nausea vomiting or diarrhea.    Past Medical History:  Diagnosis Date   Anxiety    Depression     Patient Active Problem List   Diagnosis Date Noted   Vitamin D  deficiency 05/07/2023   Irregular menstruation 05/07/2023   Hyperthyroidism 05/07/2023   History of herpes genitalis 05/28/2022   Menorrhagia with regular cycle 05/28/2022    Past Surgical History:  Procedure Laterality Date   CESAREAN SECTION     MOUTH SURGERY      OB History     Gravida  3   Para  3   Term  3   Preterm      AB      Living  3      SAB      IAB      Ectopic      Multiple      Live Births  3            Home Medications    Prior to Admission medications   Medication Sig Start Date End Date Taking? Authorizing Provider  albuterol (VENTOLIN HFA) 108 (90 Base) MCG/ACT inhaler Inhale 1-2 puffs into the lungs every 6 (six) hours as needed for wheezing or shortness of breath. 06/29/24  Yes Merilee Andrea CROME, NP  azithromycin (ZITHROMAX) 250 MG tablet Take 1 tablet (250 mg total) by mouth daily. Take first 2 tablets together, then 1 every day until finished. 06/29/24  Yes Axil Copeman L, NP  predniSONE  (STERAPRED UNI-PAK 21 TAB) 10 MG (21) TBPK tablet Take by mouth daily. Take 6 tabs by mouth daily  for 2 days, then 5 tabs for 2 days, then 4 tabs for 2 days, then 3 tabs for 2 days, 2 tabs for 2 days, then 1 tab by mouth daily  for 2 days 06/29/24  Yes Merilee Andrea CROME, NP  dicyclomine  (BENTYL ) 20 MG tablet Take 1 tablet (20 mg total) by mouth 4 (four) times daily as needed (intestinal cramps). 01/08/23   Vonna Sharlet POUR, MD  ibuprofen  (ADVIL ) 800 MG tablet Take 1 tablet (800 mg total) by mouth every 8 (eight) hours as needed. 12/30/23   Barbarann Oneil BROCKS, MD  methimazole  (TAPAZOLE ) 5 MG tablet Take 2 tablets (10 mg total) by mouth every Saturday AND 2 tablets (10 mg total) every Sunday AND 1 tablet (5 mg total) every Monday, Tuesday, Wednesday, Thursday, and Friday. 05/27/24   Shamleffer, Ibtehal Jaralla, MD  valACYclovir  (VALTREX ) 500 MG tablet Take 1 tablet (500 mg total) by mouth 2 (two) times daily for 5 days for herpes outbreak 12/20/22   Constant, Peggy, MD  Vitamin D , Ergocalciferol , (DRISDOL ) 1.25 MG (50000 UNIT) CAPS capsule Take 1 capsule (50,000 Units total) by mouth every 7 (seven) days. 05/27/24   Shamleffer, Ibtehal Jaralla, MD  cetirizine  (ZYRTEC  ALLERGY) 10 MG tablet Take 1 tablet (10 mg total) by  mouth daily. 08/30/20 02/16/21  Christopher Savannah, PA-C    Family History Family History  Problem Relation Age of Onset   Heart disease Father    Heart attack Father    Stroke Father     Social History Social History   Tobacco Use   Smoking status: Every Day    Current packs/day: 0.50    Types: Cigarettes   Smokeless tobacco: Never   Tobacco comments:    1 pack per week  Vaping Use   Vaping status: Never Used  Substance Use Topics   Alcohol use: No   Drug use: Yes    Frequency: 7.0 times per week    Types: Marijuana     Allergies   Benadryl [diphenhydramine]   Review of Systems Review of Systems  Constitutional:        Reports low-grade fevers  HENT:  Positive for postnasal drip, rhinorrhea and sinus pain.   Respiratory:  Positive for cough and wheezing.   Cardiovascular: Negative.   Gastrointestinal: Negative.   Genitourinary:  Positive for frequency. Negative for vaginal bleeding, vaginal  discharge and vaginal pain.     Physical Exam Triage Vital Signs ED Triage Vitals  Encounter Vitals Group     BP 06/29/24 1223 124/67     Girls Systolic BP Percentile --      Girls Diastolic BP Percentile --      Boys Systolic BP Percentile --      Boys Diastolic BP Percentile --      Pulse Rate 06/29/24 1223 76     Resp 06/29/24 1223 18     Temp 06/29/24 1223 98.4 F (36.9 C)     Temp Source 06/29/24 1223 Oral     SpO2 06/29/24 1223 98 %     Weight --      Height --      Head Circumference --      Peak Flow --      Pain Score 06/29/24 1224 0     Pain Loc --      Pain Education --      Exclude from Growth Chart --    No data found.  Updated Vital Signs BP 124/67 (BP Location: Left Arm)   Pulse 76   Temp 98.4 F (36.9 C) (Oral)   Resp 18   SpO2 98%   Visual Acuity Right Eye Distance:   Left Eye Distance:   Bilateral Distance:    Right Eye Near:   Left Eye Near:    Bilateral Near:     Physical Exam HENT:     Right Ear: Tympanic membrane normal.     Left Ear: Tympanic membrane normal.     Nose: Congestion present.     Mouth/Throat:     Mouth: Mucous membranes are moist.   Eyes:     Pupils: Pupils are equal, round, and reactive to light.    Cardiovascular:     Rate and Rhythm: Normal rate.     Pulses: Normal pulses.  Pulmonary:     Breath sounds: Wheezing present.  Abdominal:     General: Abdomen is flat.   Neurological:     General: No focal deficit present.     Mental Status: She is alert.      UC Treatments / Results  Labs (all labs ordered are listed, but only abnormal results are displayed) Labs Reviewed  POCT URINALYSIS DIP (MANUAL ENTRY) - Abnormal; Notable for the following components:      Result  Value   Blood, UA trace-intact (*)    All other components within normal limits  POCT URINE PREGNANCY    EKG   Radiology No results found.  Procedures Procedures (including critical care time)  Medications Ordered in  UC Medications - No data to display  Initial Impression / Assessment and Plan / UC Course  I have reviewed the triage vital signs and the nursing notes.  Pertinent labs & imaging results that were available during my care of the patient were reviewed by me and considered in my medical decision making (see chart for details).    Urine pregnancy is negative Use albuterol inhaler as needed for shortness of breath or wheezing Use a humidifier at night Symptoms may be more viral in nature If you have increased symptoms wheezing or shortness of breath you need to be seen in the ER Take full dose of antibiotics with food   Final Clinical Impressions(s) / UC Diagnoses   Final diagnoses:  Viral URI with cough  Other fatigue  Wheezing     Discharge Instructions      Urine pregnancy is negative Use albuterol inhaler as needed for shortness of breath or wheezing Use a humidifier at night Symptoms may be more viral in nature If you have increased symptoms wheezing or shortness of breath you need to be seen in the ER Take full dose of antibiotics with food     ED Prescriptions     Medication Sig Dispense Auth. Provider   albuterol (VENTOLIN HFA) 108 (90 Base) MCG/ACT inhaler Inhale 1-2 puffs into the lungs every 6 (six) hours as needed for wheezing or shortness of breath. 18 g Merilee Hollering L, NP   predniSONE  (STERAPRED UNI-PAK 21 TAB) 10 MG (21) TBPK tablet Take by mouth daily. Take 6 tabs by mouth daily  for 2 days, then 5 tabs for 2 days, then 4 tabs for 2 days, then 3 tabs for 2 days, 2 tabs for 2 days, then 1 tab by mouth daily for 2 days 42 tablet Merilee Hollering L, NP   azithromycin (ZITHROMAX) 250 MG tablet Take 1 tablet (250 mg total) by mouth daily. Take first 2 tablets together, then 1 every day until finished. 6 tablet Merilee Hollering CROME, NP      PDMP not reviewed this encounter.   Merilee Hollering CROME, NP 06/29/24 1331

## 2024-06-29 NOTE — Discharge Instructions (Addendum)
 Urine pregnancy is negative Use albuterol inhaler as needed for shortness of breath or wheezing Use a humidifier at night Symptoms may be more viral in nature If you have increased symptoms wheezing or shortness of breath you need to be seen in the ER Take full dose of antibiotics with food

## 2024-07-09 ENCOUNTER — Other Ambulatory Visit: Payer: Self-pay

## 2024-09-08 ENCOUNTER — Ambulatory Visit (HOSPITAL_COMMUNITY)
Admission: EM | Admit: 2024-09-08 | Discharge: 2024-09-08 | Disposition: A | Discharge status: Home / Self Care | Attending: Physician Assistant | Admitting: Physician Assistant

## 2024-09-08 ENCOUNTER — Encounter (HOSPITAL_COMMUNITY): Payer: Self-pay | Admitting: Emergency Medicine

## 2024-09-08 DIAGNOSIS — R109 Unspecified abdominal pain: Secondary | ICD-10-CM | POA: Diagnosis present

## 2024-09-08 DIAGNOSIS — R197 Diarrhea, unspecified: Secondary | ICD-10-CM | POA: Diagnosis present

## 2024-09-08 DIAGNOSIS — R112 Nausea with vomiting, unspecified: Secondary | ICD-10-CM | POA: Insufficient documentation

## 2024-09-08 LAB — CBC WITH DIFFERENTIAL/PLATELET
Abs Immature Granulocytes: 0.05 K/uL (ref 0.00–0.07)
Basophils Absolute: 0 K/uL (ref 0.0–0.1)
Basophils Relative: 0 %
Eosinophils Absolute: 0.5 K/uL (ref 0.0–0.5)
Eosinophils Relative: 5 %
HCT: 43.5 % (ref 36.0–46.0)
Hemoglobin: 14.4 g/dL (ref 12.0–15.0)
Immature Granulocytes: 0 %
Lymphocytes Relative: 19 %
Lymphs Abs: 2.2 K/uL (ref 0.7–4.0)
MCH: 28.5 pg (ref 26.0–34.0)
MCHC: 33.1 g/dL (ref 30.0–36.0)
MCV: 86.1 fL (ref 80.0–100.0)
Monocytes Absolute: 1 K/uL (ref 0.1–1.0)
Monocytes Relative: 8 %
Neutro Abs: 7.7 K/uL (ref 1.7–7.7)
Neutrophils Relative %: 68 %
Platelets: 312 K/uL (ref 150–400)
RBC: 5.05 MIL/uL (ref 3.87–5.11)
RDW: 13.4 % (ref 11.5–15.5)
WBC: 11.4 K/uL — ABNORMAL HIGH (ref 4.0–10.5)
nRBC: 0 % (ref 0.0–0.2)

## 2024-09-08 LAB — COMPREHENSIVE METABOLIC PANEL WITH GFR
ALT: 12 U/L (ref 0–44)
AST: 20 U/L (ref 15–41)
Albumin: 4 g/dL (ref 3.5–5.0)
Alkaline Phosphatase: 47 U/L (ref 38–126)
Anion gap: 12 (ref 5–15)
BUN: <5 mg/dL — ABNORMAL LOW (ref 6–20)
CO2: 23 mmol/L (ref 22–32)
Calcium: 9.1 mg/dL (ref 8.9–10.3)
Chloride: 100 mmol/L (ref 98–111)
Creatinine, Ser: 0.69 mg/dL (ref 0.44–1.00)
GFR, Estimated: >60 mL/min (ref >60)
Glucose, Bld: 78 mg/dL (ref 70–99)
Potassium: 3.5 mmol/L (ref 3.5–5.1)
Sodium: 135 mmol/L (ref 135–145)
Total Bilirubin: 0.9 mg/dL (ref 0.0–1.2)
Total Protein: 7.9 g/dL (ref 6.5–8.1)

## 2024-09-08 LAB — POCT URINALYSIS DIP (MANUAL ENTRY)
Bilirubin, UA: NEGATIVE
Glucose, UA: NEGATIVE mg/dL
Ketones, POC UA: NEGATIVE mg/dL
Leukocytes, UA: NEGATIVE
Nitrite, UA: NEGATIVE
Protein Ur, POC: NEGATIVE mg/dL
Spec Grav, UA: 1.025 (ref 1.010–1.025)
Urobilinogen, UA: 0.2 U/dL
pH, UA: 5.5 (ref 5.0–8.0)

## 2024-09-08 LAB — POCT URINE PREGNANCY: Preg Test, Ur: NEGATIVE

## 2024-09-08 LAB — HCG, QUANTITATIVE, PREGNANCY: hCG, Beta Chain, Quant, S: <1 m[IU]/mL (ref <5)

## 2024-09-08 NOTE — ED Triage Notes (Signed)
 Pt reports had menstrual at the end of August. Reports did home pregnancy test that was positive. Pt having abd cramping and back pains for couple days that doesn't feel like her normal cycle cramping.

## 2024-09-08 NOTE — ED Provider Notes (Signed)
 [facility]    CSN: [id number] Arrival date & time: 09/08/24  1618      History   Chief Complaint Chief Complaint  Patient presents with   Possible Pregnancy    HPI Betty Thompson is a 38 y.o. female.   Patient presents today with several day history of GI symptoms including abdominal cramping, diarrhea, nausea/vomiting.  She reports 1-2 episodes of emesis when she first wakes up in the morning.  Denies any history of GERD or other gastrointestinal disorder.  She was concerned that she could be pregnant and so took an at home test that was positive.  LMP was 08/24/2024.  She denies any fever, nausea, vomiting.  She denies any recent dietary changes or medication changes, suspicious food intake, antibiotic use.  She reports that the discomfort is rated 3/4 on a 0-10 pain scale, described as cramping, no aggravating alleviating factors identified.  She denies any associated hematemesis, melena, hematochezia.    Past Medical History:  Diagnosis Date   Anxiety    Depression     Patient Active Problem List   Diagnosis Date Noted   Vitamin D  deficiency 05/07/2023   Irregular menstruation 05/07/2023   Hyperthyroidism 05/07/2023   History of herpes genitalis 05/28/2022   Menorrhagia with regular cycle 05/28/2022    Past Surgical History:  Procedure Laterality Date   CESAREAN SECTION     MOUTH SURGERY      OB History     Gravida  3   Para  3   Term  3   Preterm      AB      Living  3      SAB      IAB      Ectopic      Multiple      Live Births  3            Home Medications    Prior to Admission medications   Medication Sig Start Date End Date Taking? Authorizing Provider  albuterol  (VENTOLIN  HFA) 108 (90 Base) MCG/ACT inhaler Inhale 1-2 puffs into the lungs every 6 (six) hours as needed for wheezing or shortness of breath. 06/29/24   Merilee Andrea CROME, NP  dicyclomine  (BENTYL ) 20 MG tablet Take 1 tablet (20 mg total) by mouth 4 (four)  times daily as needed (intestinal cramps). 01/08/23   Vonna Sharlet POUR, MD  ibuprofen  (ADVIL ) 800 MG tablet Take 1 tablet (800 mg total) by mouth every 8 (eight) hours as needed. 12/30/23   Barbarann Oneil BROCKS, MD  methimazole  (TAPAZOLE ) 5 MG tablet Take 2 tablets (10 mg total) by mouth every Saturday AND 2 tablets (10 mg total) every Sunday AND 1 tablet (5 mg total) every Monday, Tuesday, Wednesday, Thursday, and Friday. 05/27/24   Shamleffer, Ibtehal Jaralla, MD  valACYclovir  (VALTREX ) 500 MG tablet Take 1 tablet (500 mg total) by mouth 2 (two) times daily for 5 days for herpes outbreak 12/20/22   Constant, Peggy, MD  Vitamin D , Ergocalciferol , (DRISDOL ) 1.25 MG (50000 UNIT) CAPS capsule Take 1 capsule (50,000 Units total) by mouth every 7 (seven) days. 05/27/24   Shamleffer, Ibtehal Jaralla, MD  cetirizine  (ZYRTEC  ALLERGY) 10 MG tablet Take 1 tablet (10 mg total) by mouth daily. 08/30/20 02/16/21  Christopher Savannah, PA-C    Family History Family History  Problem Relation Age of Onset   Heart disease Father    Heart attack Father    Stroke Father     Social History Social History  Tobacco Use   Smoking status: Every Day    Current packs/day: 0.50    Types: Cigarettes   Smokeless tobacco: Never   Tobacco comments:    1 pack per week  Vaping Use   Vaping status: Never Used  Substance Use Topics   Alcohol use: No   Drug use: Yes    Frequency: 7.0 times per week    Types: Marijuana     Allergies   Benadryl [diphenhydramine]   Review of Systems Review of Systems  Constitutional:  Positive for activity change. Negative for appetite change, fatigue and fever.  Respiratory:  Negative for shortness of breath.   Cardiovascular:  Negative for chest pain.  Gastrointestinal:  Positive for abdominal pain, diarrhea, nausea and vomiting.  Genitourinary:  Negative for dysuria, frequency, pelvic pain, urgency, vaginal bleeding, vaginal discharge and vaginal pain.     Physical Exam Triage Vital  Signs ED Triage Vitals  Encounter Vitals Group     BP 09/08/24 1707 126/84     Girls Systolic BP Percentile --      Girls Diastolic BP Percentile --      Boys Systolic BP Percentile --      Boys Diastolic BP Percentile --      Pulse Rate 09/08/24 1707 89     Resp 09/08/24 1707 17     Temp 09/08/24 1707 98.9 F (37.2 C)     Temp Source 09/08/24 1707 Oral     SpO2 09/08/24 1707 98 %     Weight --      Height --      Head Circumference --      Peak Flow --      Pain Score 09/08/24 1706 5     Pain Loc --      Pain Education --      Exclude from Growth Chart --    No data found.  Updated Vital Signs BP 126/84 (BP Location: Right Arm)   Pulse 89   Temp 98.9 F (37.2 C) (Oral)   Resp 17   LMP 08/24/2024 (Approximate)   SpO2 98%   Visual Acuity Right Eye Distance:   Left Eye Distance:   Bilateral Distance:    Right Eye Near:   Left Eye Near:    Bilateral Near:     Physical Exam Vitals reviewed.  Constitutional:      General: She is awake. She is not in acute distress.    Appearance: Normal appearance. She is well-developed. She is not ill-appearing.     Comments: Very pleasant female presented age in no acute distress sitting comfortably in exam room  HENT:     Head: Normocephalic and atraumatic.     Mouth/Throat:     Pharynx: Uvula midline. No oropharyngeal exudate or posterior oropharyngeal erythema.  Cardiovascular:     Rate and Rhythm: Normal rate and regular rhythm.     Heart sounds: Normal heart sounds, S1 normal and S2 normal. No murmur heard. Pulmonary:     Effort: Pulmonary effort is normal.     Breath sounds: Normal breath sounds. No wheezing, rhonchi or rales.     Comments: Clear to auscultation bilaterally Abdominal:     General: Bowel sounds are normal.     Palpations: Abdomen is soft.     Tenderness: There is no abdominal tenderness. There is no right CVA tenderness, left CVA tenderness, guarding or rebound.     Comments: Benign abdominal exam.   Psychiatric:  Behavior: Behavior is cooperative.      UC Treatments / Results  Labs (all labs ordered are listed, but only abnormal results are displayed) Labs Reviewed  POCT URINALYSIS DIP (MANUAL ENTRY) - Abnormal; Notable for the following components:      Result Value   Blood, UA trace-lysed (*)    All other components within normal limits  POCT URINE PREGNANCY - Normal  CBC WITH DIFFERENTIAL/PLATELET  COMPREHENSIVE METABOLIC PANEL WITH GFR  HCG, QUANTITATIVE, PREGNANCY    EKG   Radiology No results found.  Procedures Procedures (including critical care time)  Medications Ordered in UC Medications - No data to display  Initial Impression / Assessment and Plan / UC Course  I have reviewed the triage vital signs and the nursing notes.  Pertinent labs & imaging results that were available during my care of the patient were reviewed by me and considered in my medical decision making (see chart for details).     Patient is well-appearing, afebrile, nontoxic, nontachycardic.  Vital signs and physical exam are reassuring with no indication for emergent evaluation or imaging.  Urine pregnancy test was negative in clinic.  UA showed trace blood but was otherwise normal and this has been present for at least several months.  She denies any urinary symptoms so culture was deferred.  Patient was very concerned that she could have had a false negative urine pregnancy test in clinic so we will obtain serum hCG to ensure that this is not contributing to her nausea/vomiting symptoms.  Given she has had ongoing nausea and vomiting CBC and CMP were also obtained and we will contact her if these are abnormal.  We will defer prescribing any medication until we receive her serum hCG results but if negative will consider sending in Zofran .  If positive will consider Diclegis and follow-up with OB/GYN.  We discussed that if anything worsens or changes she needs to be seen immediately.   Strict return precautions given.  Excuse note provided.  Final Clinical Impressions(s) / UC Diagnoses   Final diagnoses:  Abdominal cramping  Nausea vomiting and diarrhea     Discharge Instructions      Your urine pregnancy test was negative in clinic today.  I will contact you if any of your blood work is abnormal.  Eat a bland diet and avoid spicy/acidic/fatty foods.  Make sure you are drinking plenty of fluid.  If you have any severe abdominal pain, pain in 1 part of your abdomen, nausea/vomit despite medication, chest pain, shortness of breath, blood in your stool, blood in your vomit you need to be seen immediately.     ED Prescriptions   None    PDMP not reviewed this encounter.   Sherrell Rocky POUR, PA-C 09/08/24 [id number]

## 2024-09-08 NOTE — Discharge Instructions (Signed)
 Your urine pregnancy test was negative in clinic today.  I will contact you if any of your blood work is abnormal.  Eat a bland diet and avoid spicy/acidic/fatty foods.  Make sure you are drinking plenty of fluid.  If you have any severe abdominal pain, pain in 1 part of your abdomen, nausea/vomit despite medication, chest pain, shortness of breath, blood in your stool, blood in your vomit you need to be seen immediately.

## 2024-09-09 ENCOUNTER — Ambulatory Visit (HOSPITAL_COMMUNITY): Payer: Self-pay

## 2024-09-15 ENCOUNTER — Other Ambulatory Visit: Payer: Self-pay

## 2024-09-15 ENCOUNTER — Encounter: Payer: Self-pay | Admitting: Obstetrics & Gynecology

## 2024-09-15 ENCOUNTER — Encounter: Payer: Self-pay | Admitting: Internal Medicine

## 2024-09-15 ENCOUNTER — Other Ambulatory Visit: Payer: Self-pay | Admitting: Internal Medicine

## 2024-09-15 MED ORDER — PROPYLTHIOURACIL 50 MG PO TABS
50.0000 mg | ORAL_TABLET | Freq: Every day | ORAL | 3 refills | Status: AC
  Filled 2024-09-15: qty 90, 90d supply, fill #0

## 2024-09-21 ENCOUNTER — Ambulatory Visit

## 2024-09-24 ENCOUNTER — Other Ambulatory Visit: Payer: Self-pay

## 2024-10-13 ENCOUNTER — Ambulatory Visit

## 2024-11-01 ENCOUNTER — Encounter: Payer: Self-pay | Admitting: Radiology
